# Patient Record
Sex: Male | Born: 1960 | Race: White | Hispanic: No | Marital: Single | Smoking: Never smoker
Health system: Southern US, Community
[De-identification: ages and names within clinical notes are randomized; demographics above are authoritative.]

## PROBLEM LIST (undated history)

## (undated) DIAGNOSIS — Z8269 Family history of other diseases of the musculoskeletal system and connective tissue: Secondary | ICD-10-CM

## (undated) DIAGNOSIS — R946 Abnormal results of thyroid function studies: Secondary | ICD-10-CM

## (undated) DIAGNOSIS — R1314 Dysphagia, pharyngoesophageal phase: Secondary | ICD-10-CM

## (undated) DIAGNOSIS — M791 Myalgia, unspecified site: Secondary | ICD-10-CM

## (undated) DIAGNOSIS — J309 Allergic rhinitis, unspecified: Secondary | ICD-10-CM

## (undated) DIAGNOSIS — M1712 Unilateral primary osteoarthritis, left knee: Secondary | ICD-10-CM

## (undated) DIAGNOSIS — J029 Acute pharyngitis, unspecified: Secondary | ICD-10-CM

## (undated) DIAGNOSIS — N200 Calculus of kidney: Secondary | ICD-10-CM

## (undated) DIAGNOSIS — Z8616 Personal history of COVID-19: Secondary | ICD-10-CM

## (undated) DIAGNOSIS — J209 Acute bronchitis, unspecified: Secondary | ICD-10-CM

## (undated) DIAGNOSIS — I10 Essential (primary) hypertension: Secondary | ICD-10-CM

## (undated) DIAGNOSIS — F4320 Adjustment disorder, unspecified: Secondary | ICD-10-CM

## (undated) DIAGNOSIS — Z8261 Family history of arthritis: Secondary | ICD-10-CM

## (undated) DIAGNOSIS — Z840 Family history of diseases of the skin and subcutaneous tissue: Secondary | ICD-10-CM

## (undated) DIAGNOSIS — N529 Male erectile dysfunction, unspecified: Secondary | ICD-10-CM

## (undated) DIAGNOSIS — E559 Vitamin D deficiency, unspecified: Secondary | ICD-10-CM

## (undated) DIAGNOSIS — D582 Other hemoglobinopathies: Secondary | ICD-10-CM

## (undated) DIAGNOSIS — J329 Chronic sinusitis, unspecified: Secondary | ICD-10-CM

## (undated) DIAGNOSIS — M5410 Radiculopathy, site unspecified: Secondary | ICD-10-CM

## (undated) DIAGNOSIS — K5900 Constipation, unspecified: Secondary | ICD-10-CM

## (undated) DIAGNOSIS — R030 Elevated blood-pressure reading, without diagnosis of hypertension: Secondary | ICD-10-CM

## (undated) DIAGNOSIS — N400 Enlarged prostate without lower urinary tract symptoms: Secondary | ICD-10-CM

## (undated) DIAGNOSIS — K529 Noninfective gastroenteritis and colitis, unspecified: Secondary | ICD-10-CM

## (undated) DIAGNOSIS — M2550 Pain in unspecified joint: Secondary | ICD-10-CM

## (undated) DIAGNOSIS — E291 Testicular hypofunction: Secondary | ICD-10-CM

## (undated) DIAGNOSIS — G4730 Sleep apnea, unspecified: Secondary | ICD-10-CM

## (undated) HISTORY — DX: Allergic rhinitis, unspecified: J30.9

## (undated) HISTORY — DX: Constipation, unspecified: K59.00

## (undated) HISTORY — DX: Chronic sinusitis, unspecified: J32.9

## (undated) HISTORY — DX: Acute pharyngitis, unspecified: J02.9

## (undated) HISTORY — DX: Pain in unspecified joint: M25.50

## (undated) HISTORY — DX: Testicular hypofunction: E29.1

## (undated) HISTORY — DX: Family history of diseases of the skin and subcutaneous tissue: Z84.0

## (undated) HISTORY — DX: Personal history of COVID-19: Z86.16

## (undated) HISTORY — DX: Adjustment disorder, unspecified: F43.20

## (undated) HISTORY — DX: Abnormal results of thyroid function studies: R94.6

## (undated) HISTORY — DX: Dysphagia, pharyngoesophageal phase: R13.14

## (undated) HISTORY — DX: Male erectile dysfunction, unspecified: N52.9

## (undated) HISTORY — DX: Myalgia, unspecified site: M79.10

## (undated) HISTORY — DX: Other hemoglobinopathies: D58.2

## (undated) HISTORY — DX: Family history of other diseases of the musculoskeletal system and connective tissue: Z82.69

## (undated) HISTORY — DX: Radiculopathy, site unspecified: M54.10

## (undated) HISTORY — DX: Elevated blood-pressure reading, without diagnosis of hypertension: R03.0

## (undated) HISTORY — DX: Acute bronchitis, unspecified: J20.9

## (undated) HISTORY — DX: Morbid (severe) obesity due to excess calories: E66.01

## (undated) HISTORY — DX: Unilateral primary osteoarthritis, left knee: M17.12

## (undated) HISTORY — DX: Vitamin D deficiency, unspecified: E55.9

## (undated) HISTORY — DX: Noninfective gastroenteritis and colitis, unspecified: K52.9

## (undated) HISTORY — DX: Family history of arthritis: Z82.61

## (undated) HISTORY — DX: Essential (primary) hypertension: I10

## (undated) HISTORY — DX: Sleep apnea, unspecified: G47.30

## (undated) HISTORY — DX: Benign prostatic hyperplasia without lower urinary tract symptoms: N40.0

## (undated) HISTORY — DX: Calculus of kidney: N20.0

---

## 2013-08-29 ENCOUNTER — Other Ambulatory Visit: Payer: Self-pay | Admitting: Gastroenterology

## 2013-08-29 DIAGNOSIS — R1310 Dysphagia, unspecified: Secondary | ICD-10-CM

## 2013-08-31 ENCOUNTER — Ambulatory Visit
Admission: RE | Admit: 2013-08-31 | Discharge: 2013-08-31 | Disposition: A | Discharge status: Home / Self Care | Payer: 59 | Source: Ambulatory Visit | Attending: Gastroenterology | Admitting: Gastroenterology

## 2013-08-31 DIAGNOSIS — R1310 Dysphagia, unspecified: Secondary | ICD-10-CM

## 2013-09-13 ENCOUNTER — Other Ambulatory Visit: Payer: Self-pay | Admitting: Gastroenterology

## 2013-10-03 ENCOUNTER — Encounter (HOSPITAL_COMMUNITY): Payer: Self-pay | Admitting: Pharmacy Technician

## 2013-10-11 ENCOUNTER — Encounter (HOSPITAL_COMMUNITY): Admission: RE | Payer: Self-pay | Source: Ambulatory Visit

## 2013-10-11 ENCOUNTER — Ambulatory Visit (HOSPITAL_COMMUNITY): Admission: RE | Admit: 2013-10-11 | Payer: 59 | Source: Ambulatory Visit | Admitting: Gastroenterology

## 2013-10-11 SURGERY — EGD (ESOPHAGOGASTRODUODENOSCOPY)
Anesthesia: Moderate Sedation

## 2013-11-01 ENCOUNTER — Other Ambulatory Visit: Payer: Self-pay | Admitting: Internal Medicine

## 2013-11-01 ENCOUNTER — Ambulatory Visit
Admission: RE | Admit: 2013-11-01 | Discharge: 2013-11-01 | Disposition: A | Discharge status: Home / Self Care | Payer: 59 | Source: Ambulatory Visit | Attending: Internal Medicine | Admitting: Internal Medicine

## 2013-11-01 DIAGNOSIS — M545 Low back pain, unspecified: Secondary | ICD-10-CM

## 2013-11-01 DIAGNOSIS — R109 Unspecified abdominal pain: Secondary | ICD-10-CM

## 2013-11-02 ENCOUNTER — Other Ambulatory Visit: Payer: 59

## 2013-11-05 ENCOUNTER — Other Ambulatory Visit: Payer: 59

## 2020-05-08 ENCOUNTER — Other Ambulatory Visit (HOSPITAL_COMMUNITY): Payer: Self-pay | Admitting: Internal Medicine

## 2020-08-15 ENCOUNTER — Telehealth (HOSPITAL_COMMUNITY): Payer: Self-pay | Admitting: Family

## 2020-08-15 NOTE — Telephone Encounter (Signed)
Called to discuss with [name] about Covid symptoms and potential candidacy for the use of sotrovimab, a combination monoclonal antibody infusion for those with mild to moderate Covid symptoms and at a high risk of hospitalization.     Pt is qualified for this infusion at the infusion center due to co-morbid conditions and/or a member of an at-risk group, however unable to reach patient. VM left.   Yeira Gulden,NP

## 2021-01-15 ENCOUNTER — Other Ambulatory Visit (HOSPITAL_COMMUNITY): Payer: Self-pay

## 2021-01-17 ENCOUNTER — Other Ambulatory Visit (HOSPITAL_COMMUNITY): Payer: Self-pay

## 2021-01-17 MED ORDER — AMLODIPINE BESYLATE-VALSARTAN 5-160 MG PO TABS
ORAL_TABLET | ORAL | 0 refills | Status: DC
  Filled 2021-01-17: qty 30, 30d supply, fill #0

## 2021-01-17 MED ORDER — AMLODIPINE BESYLATE-VALSARTAN 5-160 MG PO TABS: ORAL_TABLET | ORAL | 0 refills | Status: DC

## 2021-02-25 ENCOUNTER — Other Ambulatory Visit (HOSPITAL_COMMUNITY): Payer: Self-pay

## 2021-02-25 MED ORDER — AMLODIPINE BESYLATE-VALSARTAN 5-160 MG PO TABS
ORAL_TABLET | ORAL | 3 refills | Status: DC
  Filled 2021-02-25: qty 30, 30d supply, fill #0

## 2021-02-26 ENCOUNTER — Other Ambulatory Visit (HOSPITAL_COMMUNITY): Payer: Self-pay

## 2021-06-18 ENCOUNTER — Other Ambulatory Visit (HOSPITAL_COMMUNITY): Payer: Self-pay

## 2021-10-02 ENCOUNTER — Other Ambulatory Visit (HOSPITAL_COMMUNITY): Payer: Self-pay

## 2021-10-02 MED ORDER — AMLODIPINE BESYLATE-VALSARTAN 5-160 MG PO TABS
1.0000 | ORAL_TABLET | Freq: Every day | ORAL | 3 refills | Status: DC
Start: 2021-10-02 — End: 2022-03-05
  Filled 2021-10-02 – 2021-11-08 (×2): qty 30, 30d supply, fill #0

## 2021-10-10 ENCOUNTER — Other Ambulatory Visit (HOSPITAL_COMMUNITY): Payer: Self-pay

## 2021-10-15 ENCOUNTER — Other Ambulatory Visit: Payer: Self-pay | Admitting: Sports Medicine

## 2021-10-15 DIAGNOSIS — M25562 Pain in left knee: Secondary | ICD-10-CM

## 2021-10-27 ENCOUNTER — Ambulatory Visit
Admission: RE | Admit: 2021-10-27 | Discharge: 2021-10-27 | Disposition: A | Discharge status: Home / Self Care | Payer: Self-pay | Source: Ambulatory Visit | Attending: Sports Medicine | Admitting: Sports Medicine

## 2021-10-27 ENCOUNTER — Other Ambulatory Visit: Payer: Self-pay

## 2021-10-27 DIAGNOSIS — M25562 Pain in left knee: Secondary | ICD-10-CM

## 2021-11-08 ENCOUNTER — Other Ambulatory Visit (HOSPITAL_COMMUNITY): Payer: Self-pay

## 2022-03-05 ENCOUNTER — Encounter: Payer: Self-pay | Admitting: Interventional Cardiology

## 2022-03-05 ENCOUNTER — Ambulatory Visit: Payer: 59

## 2022-03-05 ENCOUNTER — Ambulatory Visit: Payer: 59 | Admitting: Interventional Cardiology

## 2022-03-05 VITALS — BP 140/70 | HR 75 | Ht 76.0 in | Wt 332.0 lb

## 2022-03-05 DIAGNOSIS — R002 Palpitations: Secondary | ICD-10-CM

## 2022-03-05 DIAGNOSIS — I10 Essential (primary) hypertension: Secondary | ICD-10-CM | POA: Diagnosis not present

## 2022-03-05 DIAGNOSIS — R072 Precordial pain: Secondary | ICD-10-CM

## 2022-03-05 MED ORDER — METOPROLOL TARTRATE 100 MG PO TABS: ORAL_TABLET | ORAL | 0 refills | Status: DC

## 2022-03-05 NOTE — Patient Instructions (Addendum)
Medication Instructions:  Your physician recommends that you continue on your current medications as directed. Please refer to the Current Medication list given to you today.  *If you need a refill on your cardiac medications before your next appointment, please call your pharmacy*   Lab Work: Lab work to be done today--BMP If you have labs (blood work) drawn today and your tests are completely normal, you will receive your results only by: MyChart Message (if you have MyChart) OR A paper copy in the mail If you have any lab test that is abnormal or we need to change your treatment, we will call you to review the results.   Testing/Procedures: Your physician has requested that you have cardiac CT. Cardiac computed tomography (CT) is a painless test that uses an x-ray machine to take clear, detailed pictures of your heart. For further information please visit [email or web address] Please follow instruction sheet as given.   Dr [name] recommends you wear a preventice monitor for 2 weeks.    Follow-Up: At [facility], you and your health needs are our priority.  As part of our continuing mission to provide you with exceptional heart care, we have created designated Provider Care Teams.  These Care Teams include your primary Cardiologist (physician) and Advanced Practice Providers (APPs -  Physician Assistants and Nurse Practitioners) who all work together to provide you with the care you need, when you need it.  We recommend signing up for the patient portal called "MyChart".  Sign up information is provided on this After Visit Summary.  MyChart is used to connect with patients for Virtual Visits (Telemedicine).  Patients are able to view lab/test results, encounter notes, upcoming appointments, etc.  Non-urgent messages can be sent to your provider as well.   To learn more about what you can do with MyChart, go to [email or web address].    Your next appointment:   Based on  results  The format for your next appointment:   In Person  Provider:   Lance Muss, MD     Other Instructions     Your cardiac CT will be scheduled at one of the below locations:   [facility] [address] [place], [place] [address] [phone]  OR  [facility] [address] [place], [place] [address] [phone]  If scheduled at [facility], please arrive at the [facility] and Children's Entrance (Entrance C2) of [facility] 30 minutes prior to test start time. You can use the FREE valet parking offered at entrance C (encouraged to control the heart rate for the test)  Proceed to the [facility] Radiology Department (first floor) to check-in and test prep.  All radiology patients and guests should use entrance C2 at [facility], accessed from [facility], even though the hospital's physical address listed is [address]    If scheduled at [facility], please arrive 15 mins early for check-in and test prep.  Please follow these instructions carefully (unless otherwise directed):  Hold all erectile dysfunction medications at least 3 days (72 hrs) prior to test.  On the Night Before the Test: Be sure to Drink plenty of water. Do not consume any caffeinated/decaffeinated beverages or chocolate 12 hours prior to your test. Do not take any antihistamines 12 hours prior to your test.   On the Day of the Test: Drink plenty of water until 1 hour prior to the test. Do not eat any  food 4 hours prior to the test. You may take your regular medications prior to the test.  Take metoprolol (Lopressor) two hours prior to test. HOLD Furosemide/Hydrochlorothiazide morning of the test.       After the Test: Drink plenty of water. After receiving IV contrast, you may experience a mild flushed feeling. This is normal. On occasion, you may  experience a mild rash up to 24 hours after the test. This is not dangerous. If this occurs, you can take Benadryl 25 mg and increase your fluid intake. If you experience trouble breathing, this can be serious. If it is severe call 911 IMMEDIATELY. If it is mild, please call our office. If you take any of these medications: Glipizide/Metformin, Avandament, Glucavance, please do not take 48 hours after completing test unless otherwise instructed.  We will call to schedule your test 2-4 weeks out understanding that some insurance companies will need an authorization prior to the service being performed.   For non-scheduling related questions, please contact the cardiac imaging nurse navigator should you have any questions/concerns: Rockwell Alexandria, Cardiac Imaging Nurse Navigator Larey Brick, Cardiac Imaging Nurse Navigator [facility] Direct Office Dial: [phone]   For scheduling needs, including cancellations and rescheduling, please call [place], [phone].    Important Information About Sugar

## 2022-03-05 NOTE — Progress Notes (Signed)
Cardiology Office Note   Date:  03/05/2022   ID:  [name], DOB 1961/01/22, MRN [id number]  PCP:  Pcp, No    No chief complaint on file.  Chest pain  Wt Readings from Last 3 Encounters:  03/05/22 (!) 332 lb (150.6 kg)       History of Present Illness: Brett Lowe is a 61 y.o. male who is being seen today for the evaluation of chest pain, palpitations at the request of Marden Noble, MD.   Palpitations were worse in 3/23 when he was taking Wegovy.  Sx improved when he stopped.  He still has occasional flutter.  Feels like a skip beat and a stop for a second.  He had a prior stress test in 2013 at [facility].  Negative stress test. Nothing recent.  Father died at 104 of an MI.  Siblings are healthy, no heart issues.   Has had chest pain- like a tightness/pressure during intercourse. Worse with activity.   Had a left knee injury several months ago.    He lifts weight for exercise.  Walking now limited due to knee pain.  Denies : Dizziness. Leg edema. Nitroglycerin use. Orthopnea.  Paroxysmal nocturnal dyspnea. Shortness of breath. Syncope.       Past Medical History:  Diagnosis Date   Abnormal hemoglobin (HCC)    Abnormal thyroid function test    Acute bronchitis    Adjustment disorder    Allergic rhinitis    Arthralgia of multiple joints    Arthritis of left knee    BPH (benign prostatic hyperplasia)    Calculus of kidney    Chronic sinusitis    Constipation    Dysphagia, pharyngoesophageal phase    ED (erectile dysfunction)    Elevated blood pressure reading    Family history of lupus erythematosus    Family history of polymyalgia rheumatica    Family history of rheumatoid arthritis    Gastroenteritis    History of COVID-19    HTN (hypertension)    Hypogonadism in male    Male erectile dysfunction, unspecified    Morbid obesity (HCC)    Morbidly obese (HCC)    Myalgia    Pharyngitis    Polyarthralgia    Radiculopathy    Sleep apnea    Testicular  hypofunction    Vitamin D deficiency        Current Outpatient Medications  Medication Sig Dispense Refill   amLODipine-valsartan (EXFORGE) 5-160 MG tablet TAKE 1/2-1 TABLET BY MOUTH ONCE A DAY TO KEEP BLOOD PRESSURE SYSTOLIC LESS THAN 130 30 tablet 0   aspirin EC 81 MG tablet Take 81 mg by mouth daily. Swallow whole.     B Complex-C (B-COMPLEX WITH VITAMIN C) tablet Take 1 tablet by mouth daily.     naproxen sodium (ANAPROX) 220 MG tablet Take 440-660 mg by mouth 2 (two) times daily as needed (pain).     testosterone enanthate (DELATESTRYL) 200 MG/ML injection Inject into the muscle every 14 (fourteen) days. For IM use only     VITAMIN D, CHOLECALCIFEROL, PO Take 2,500 Units by mouth daily.     No current facility-administered medications for this visit.    Allergies:   Penicillins    Social History:  The patient  reports that he has never smoked. He does not have any smokeless tobacco history on file.   Family History:  The patient's family history includes Heart attack in his father; Stroke in his brother.  ROS:  Please see the history of present illness.   Otherwise, review of systems are positive for difficulty losing weight.   All other systems are reviewed and negative.    PHYSICAL EXAM: VS:  BP 140/70 (BP Location: Left Arm, Patient Position: Sitting, Cuff Size: Normal)   Pulse 75   Ht 6\' 4"  (1.93 m)   Wt (!) 332 lb (150.6 kg)   BMI 40.41 kg/m  , BMI Body mass index is 40.41 kg/m. GEN: Well nourished, well developed, in no acute distress HEENT: normal Neck: no JVD, carotid bruits, or masses Cardiac: RRR; no murmurs, rubs, or gallops,no edema  Respiratory:  clear to auscultation bilaterally, normal work of breathing GI: soft, nontender, nondistended, + BS MS: no deformity or atrophy Skin: warm and dry, no rash Neuro:  Strength and sensation are intact Psych: euthymic mood, full affect   EKG:   The ekg ordered today demonstrates Normal ECG   Recent  Labs: No results found for requested labs within last 365 days.   Lipid Panel No results found for: "CHOL", "TRIG", "HDL", "CHOLHDL", "VLDL", "LDLCALC", "LDLDIRECT"   Other studies Reviewed: Additional studies/ records that were reviewed today with results demonstrating: labs reviewed.   ASSESSMENT AND PLAN:  Chest pain: Some pressure and tightness.   Plan for CTA.  Take metoprolol 100 mg x 1 before the CT scan.  Several RF for CAD.  HTN: Avoid processed foods.  Limit excessive salt.  The current medical regimen is effective;  continue present plan and medications. Morbid obesity: High-fiber diet.  Whole food plant-based diet.  Avoiding red meat. Palpitations: plan for 2 week Zio patch.  Sounds like premature beats.   Current medicines are reviewed at length with the patient today.  The patient concerns regarding his medicines were addressed.  The following changes have been made:  No change  Labs/ tests ordered today include:  No orders of the defined types were placed in this encounter.   Recommend 150 minutes/week of aerobic exercise Low fat, low carb, high fiber diet recommended  Disposition:   FU for CTA   Signed, , MD  03/05/2022 4:19 PM    [facility] [address] [place], [place], [place]  [place] Phone: [phone]; Fax: [phone]

## 2022-03-05 NOTE — Progress Notes (Unsigned)
Enrolled patient for a 14 day preventice long term monitor to be mailed to patients home

## 2022-03-06 LAB — BASIC METABOLIC PANEL WITH GFR
BUN/Creatinine Ratio: 11 (ref 10–24)
BUN: 11 mg/dL (ref 8–27)
CO2: 22 mmol/L (ref 20–29)
Calcium: 9.7 mg/dL (ref 8.6–10.2)
Chloride: 104 mmol/L (ref 96–106)
Creatinine, Ser: 0.96 mg/dL (ref 0.76–1.27)
Glucose: 85 mg/dL (ref 70–99)
Potassium: 4.5 mmol/L (ref 3.5–5.2)
Sodium: 141 mmol/L (ref 134–144)
eGFR: 90 mL/min/{1.73_m2}

## 2022-03-31 ENCOUNTER — Telehealth (HOSPITAL_COMMUNITY): Payer: Self-pay | Admitting: *Deleted

## 2022-03-31 NOTE — Telephone Encounter (Signed)
Reaching out to patient to offer assistance regarding upcoming cardiac imaging study; pt verbalizes understanding of appt date/time, parking situation and where to check in, pre-test NPO status and medications ordered, and verified current allergies; name and call back number provided for further questions should they arise  Larey Brick RN Navigator Cardiac Imaging [facility] [phone] office [phone] cell  Patient to take 100mg  metoprolol tartrate two hours prior to his cardiac CT scan. He aware to hold his Viagra and to arrive at 2:30pm.

## 2022-04-01 ENCOUNTER — Other Ambulatory Visit: Payer: Self-pay | Admitting: Cardiology

## 2022-04-01 ENCOUNTER — Ambulatory Visit (HOSPITAL_COMMUNITY)
Admission: RE | Admit: 2022-04-01 | Discharge: 2022-04-01 | Disposition: A | Discharge status: Home / Self Care | Payer: 59 | Source: Ambulatory Visit | Attending: Interventional Cardiology | Admitting: Interventional Cardiology

## 2022-04-01 DIAGNOSIS — R072 Precordial pain: Secondary | ICD-10-CM | POA: Insufficient documentation

## 2022-04-01 DIAGNOSIS — I2510 Atherosclerotic heart disease of native coronary artery without angina pectoris: Secondary | ICD-10-CM | POA: Diagnosis not present

## 2022-04-01 DIAGNOSIS — R931 Abnormal findings on diagnostic imaging of heart and coronary circulation: Secondary | ICD-10-CM

## 2022-04-01 MED ORDER — IOHEXOL 350 MG/ML SOLN
100.0000 mL | Freq: Once | INTRAVENOUS | Status: AC | PRN
  Administered 2022-04-01: 100 mL via INTRAVENOUS

## 2022-04-01 MED ORDER — NITROGLYCERIN 0.4 MG SL SUBL
SUBLINGUAL_TABLET | SUBLINGUAL | Status: AC
  Filled 2022-04-01: qty 2

## 2022-04-01 MED ORDER — NITROGLYCERIN 0.4 MG SL SUBL
0.8000 mg | SUBLINGUAL_TABLET | Freq: Once | SUBLINGUAL | Status: AC
  Administered 2022-04-01: 0.8 mg via SUBLINGUAL

## 2022-04-02 ENCOUNTER — Ambulatory Visit (HOSPITAL_BASED_OUTPATIENT_CLINIC_OR_DEPARTMENT_OTHER)
Admission: RE | Admit: 2022-04-02 | Discharge: 2022-04-02 | Disposition: A | Discharge status: Home / Self Care | Payer: 59 | Source: Ambulatory Visit | Attending: Cardiology | Admitting: Cardiology

## 2022-04-02 ENCOUNTER — Ambulatory Visit (HOSPITAL_COMMUNITY)
Admission: RE | Admit: 2022-04-02 | Discharge: 2022-04-02 | Disposition: A | Discharge status: Home / Self Care | Payer: 59 | Source: Ambulatory Visit | Attending: Cardiology | Admitting: Cardiology

## 2022-04-02 DIAGNOSIS — R931 Abnormal findings on diagnostic imaging of heart and coronary circulation: Secondary | ICD-10-CM | POA: Diagnosis not present

## 2022-04-07 ENCOUNTER — Telehealth: Payer: Self-pay

## 2022-04-07 MED ORDER — RANOLAZINE ER 500 MG PO TB12: 500.0000 mg | ORAL_TABLET | Freq: Two times a day (BID) | ORAL | 11 refills | Status: DC

## 2022-04-07 NOTE — Telephone Encounter (Signed)
Called and discussed Dr. Hoyle Barr recommendations: Can try Ranexa 500 mg BID.  If CP persists, then would consider cath.   Patient states he's not keen on taking medication but will try the Ranexa and let us know if it does not work to relieve his CP.  Ranexa 500mg  BID ordered and sent to pharmacy of choice.

## 2022-04-07 NOTE — Telephone Encounter (Signed)
-----   Message from Corky Crafts, MD sent at 04/04/2022  5:54 PM EDT ----- Can try Ranexa 500 mg BID.  If CP persists, then would consider cath.  ----- Message ----- From: Alois Cliche, LPN Sent: 8/83/2549  11:25 AM EDT To: Corky Crafts, MD  Discussed with pt ct results and would prefer not to start on Imdur if that means has to give up Viagra Will forward to Dr [name] for recommendations .Zack Seal

## 2022-04-22 ENCOUNTER — Telehealth: Payer: Self-pay | Admitting: *Deleted

## 2022-04-22 NOTE — Telephone Encounter (Signed)
-----   Message from Corky Crafts, MD sent at 04/02/2022 10:49 AM EDT ----- Blockage in the distal RCA. Everything else looks ok.  Trying Imdur as I noted on the CT FFR result. Calcified granulomas noted in the lung.  May be from prior infections but would send to PMD, [facility] for further management.

## 2022-04-22 NOTE — Telephone Encounter (Signed)
No PCP listed.  I placed call to patient.  Unable to leave message as mailbox is full.

## 2022-05-01 MED ORDER — NITROGLYCERIN 0.4 MG SL SUBL: 0.4000 mg | SUBLINGUAL_TABLET | SUBLINGUAL | 6 refills | Status: AC | PRN

## 2022-05-01 NOTE — Telephone Encounter (Signed)
I spoke with patient and reviewed Cardiac CT results with him.  He has been taking Ranexa only as needed. Had to take twice yesterday due to chest pain.  He will begin taking it twice daily.  He does not have SL NTG and is willing to take if needed.  He is aware not to use SL NTG and Viagra in the same 24 hour period.  Use of SL NTG reviewed with patient and prescription sent to [facility]. Appointment made for patient to see Dr [name] on 9/18 at 2:30 for evaluation of chest pain.  Patient reports he saw Dr Kevan Ny but does not have new PCP assigned when Dr Prince Rome. I checked with [facility] and results can still be sent to Dr Kevan Ny.  CT results faxed to Dr Kevan Ny.  Patient has not received monitor.  Will follow up with monitor department.

## 2022-05-01 NOTE — Telephone Encounter (Signed)
Pt returning call. Transferred to Elease Hashimoto, Charity fundraiser.

## 2022-05-01 NOTE — Telephone Encounter (Signed)
Unable to leave message as mailbox is full. Will send my chart message to patient asking him to contact office.

## 2022-05-02 NOTE — Telephone Encounter (Signed)
I spoke with patient and due to Dr Hoyle Barr schedule as DOD on September 18 appointment changed to 2:45 on September 18 with Robin Searing, NP.  Patient aware monitor will be applied after this appointment.

## 2022-05-04 NOTE — H&P (View-Only) (Signed)
Office Visit    Patient Name: Brett Lowe Date of Encounter: 05/05/2022  Primary Care Provider:  Pcp, No Primary Cardiologist:  [name], MD Primary Electrophysiologist: None  Chief Complaint    Brett Lowe is a 61 y.o. male with PMH of nonobstructive CAD s/p cardiac CTA 03/2022 distal lesion per FFR stable angina, palpitations who presents today for complaint of increased palpitations.  Past Medical History    Past Medical History:  Diagnosis Date   Abnormal hemoglobin (HCC)    Abnormal thyroid function test    Acute bronchitis    Adjustment disorder    Allergic rhinitis    Arthralgia of multiple joints    Arthritis of left knee    BPH (benign prostatic hyperplasia)    Calculus of kidney    Chronic sinusitis    Constipation    Dysphagia, pharyngoesophageal phase    ED (erectile dysfunction)    Elevated blood pressure reading    Family history of lupus erythematosus    Family history of polymyalgia rheumatica    Family history of rheumatoid arthritis    Gastroenteritis    History of COVID-19    HTN (hypertension)    Hypogonadism in male    Male erectile dysfunction, unspecified    Morbid obesity ([place])    Morbidly obese (HCC)    Myalgia    Pharyngitis    Polyarthralgia    Radiculopathy    Sleep apnea    Testicular hypofunction    Vitamin D deficiency    History reviewed. No pertinent surgical history.  Allergies  Allergies  Allergen Reactions   Penicillins Rash    History of Present Illness    Brett Lowe  is a 61 year old male with the above mention past medical history who presents today for complaint of increased palpitations.  He was initially seen by Dr. Irish Lack on 02/2022 for consultation by PCP for complaint of chest pain and palpitations.  Patient endorsed feelings of skipped beat that also have occasional fluttering.  Patient had stress testing completed in 2013 at [facility] that was negative.  Patient does endorse chest discomfort and  tightness during intercourse and worse with activity.  Cardiac CTA was completed and revealed distal lesion in RCA measuring 70-90% that is flow-limiting per FFR with no significant calcium located in other arteries.  He was started on Ranexa 500 mg twice daily for chest pain.  Patient was advised to not use with Viagra.  Patient has had to take medication twice recently and also advised to take as needed nitroglycerin for persistent pain.  Patient presents today for event monitor placement to evaluate palpitations further.  Mr. Yerger presents today for follow-up alone.  Since last being seen in the office patient reports that he is still experiencing chest discomfort with intercourse and exercise.  He was originally not taking Ranexa correctly and is now taking it twice daily.  He states that the pain occurs and lasts for at least an hour and he becomes panicked and gets sweaty.  He also endorses pain traveling down the left arm with this discomfort.  Is currently trying to lose weight and is watching his calories.  He denies continued palpitations and states they have slowed down since his previous visit.  His blood pressures were elevated today at 142/76 patient denies chest pain, palpitations, dyspnea, PND, orthopnea, nausea, vomiting, dizziness, syncope, edema, weight gain, or early satiety.  Home Medications    Current Outpatient Medications  Medication  Sig Dispense Refill   aspirin EC 81 MG tablet Take 81 mg by mouth daily. Swallow whole.     B Complex-C (B-COMPLEX WITH VITAMIN C) tablet Take 1 tablet by mouth daily.     metoprolol tartrate (LOPRESSOR) 100 MG tablet Take one tablet by mouth 2 hours prior to CT Scan 1 tablet 0   naproxen sodium (ANAPROX) 220 MG tablet Take 440-660 mg by mouth 2 (two) times daily as needed (pain).     nitroGLYCERIN (NITROSTAT) 0.4 MG SL tablet Place 1 tablet (0.4 mg total) under the tongue every 5 (five) minutes as needed for chest pain. 25 tablet 6   ranolazine  (RANEXA) 500 MG 12 hr tablet Take 1 tablet (500 mg total) by mouth 2 (two) times daily. 60 tablet 11   sildenafil (VIAGRA) 50 MG tablet Take 50 mg by mouth daily as needed for erectile dysfunction.     testosterone enanthate (DELATESTRYL) 200 MG/ML injection Inject into the muscle every 14 (fourteen) days. For IM use only     VITAMIN D, CHOLECALCIFEROL, PO Take 2,500 Units by mouth daily.     No current facility-administered medications for this visit.     Review of Systems  Please see the history of present illness.    (+) Chest pain with exertion (+) Left arm pain  All other systems reviewed and are otherwise negative except as noted above.  Physical Exam    Wt Readings from Last 3 Encounters:  05/05/22 (!) 325 lb (147.4 kg)  03/05/22 (!) 332 lb (150.6 kg)   VS: Vitals:   05/05/22 1505  BP: (!) 142/76  Pulse: 69  SpO2: 94%  ,Body mass index is 39.56 kg/m.  Constitutional:      Appearance: Healthy appearance. Not in distress.  Neck:     Vascular: JVD normal.  Pulmonary:     Effort: Pulmonary effort is normal.     Breath sounds: No wheezing. No rales. Diminished in the bases Cardiovascular:     Normal rate. Regular rhythm. Normal S1. Normal S2.      Murmurs: There is no murmur.  Edema:    Peripheral edema absent.  Abdominal:     Palpations: Abdomen is soft non tender. There is no hepatomegaly.  Skin:    General: Skin is warm and dry.  Neurological:     General: No focal deficit present.     Mental Status: Alert and oriented to person, place and time.     Cranial Nerves: Cranial nerves are intact.  EKG/LABS/Other Studies Reviewed    ECG personally reviewed by me today -sinus rhythm with nonspecific T wave abnormality and rate of 69 bpm with normal axis and no acute changes noted.   No results found for: "WBC", "HGB", "HCT", "MCV", "PLT" Lab Results  Component Value Date   CREATININE 0.96 03/05/2022   BUN 11 03/05/2022   NA 141 03/05/2022   K 4.5 03/05/2022    CL 104 03/05/2022   CO2 22 03/05/2022   No results found for: "ALT", "AST", "GGT", "ALKPHOS", "BILITOT" No results found for: "CHOL", "HDL", "LDLCALC", "LDLDIRECT", "TRIG", "CHOLHDL"  No results found for: "HGBA1C"  Assessment & Plan    1.  Coronary artery disease: -Cardiac CTA completed on 04/01/2022 that showed occlusive distal lesion and RCA. -Patient has recent complaints of chest pain requiring Ranexa for relief.  He was reportedly not taking medication correctly and is now taking 400 mg twice daily -Today patient reports chest pain associated with physical activity and  exertion. -Case discussed with DOD Dr.Varanasi who agrees with pursuing left heart catheterization to rule out any new ischemic changes related to cardiac CTA results -BMET and CBC today  2.  Palpitations: -Today patient reports that palpitations have decreased and density and quantity -We will hold off on ZIO monitor at this time and patient was encouraged to report if palpitations increase in quantity  3.  Hypertension: -Patient's blood pressure today was elevated today at 142/76 -We will increase Exforge to 10/360 mg daily  4.  Chest discomfort: -Today patient reports ongoing chest discomfort that last for over an hour and is relieved with nitroglycerin. -Patient advised to seek care at the emergency room if pain becomes unbearable and increases in intensity. -Please see plan as noted above regarding left heart catheterization    Disposition: Follow-up with Lance Muss, MD or APP in 2 weeks Shared Decision Making/Informed Consent The risks [stroke (1 in 1000), death (1 in 1000), kidney failure [usually temporary] (1 in 500), bleeding (1 in 200), allergic reaction [possibly serious] (1 in 200)], benefits (diagnostic support and management of coronary artery disease) and alternatives of a cardiac catheterization were discussed in detail with Mr. Doo and he is willing to proceed.   Medication  Adjustments/Labs and Tests Ordered: Current medicines are reviewed at length with the patient today.  Concerns regarding medicines are outlined above.   Signed, Napoleon Form, Leodis Rains, NP 05/05/2022, 4:16 PM [facility]

## 2022-05-04 NOTE — Progress Notes (Unsigned)
Office Visit    Patient Name: Brett Lowe Date of Encounter: 05/05/2022  Primary Care Provider:  Pcp, No Primary Cardiologist:  [name], MD Primary Electrophysiologist: None  Chief Complaint    Brett Lowe is a 61 y.o. male with PMH of nonobstructive CAD s/p cardiac CTA 03/2022 distal lesion per FFR stable angina, palpitations who presents today for complaint of increased palpitations.  Past Medical History    Past Medical History:  Diagnosis Date   Abnormal hemoglobin (HCC)    Abnormal thyroid function test    Acute bronchitis    Adjustment disorder    Allergic rhinitis    Arthralgia of multiple joints    Arthritis of left knee    BPH (benign prostatic hyperplasia)    Calculus of kidney    Chronic sinusitis    Constipation    Dysphagia, pharyngoesophageal phase    ED (erectile dysfunction)    Elevated blood pressure reading    Family history of lupus erythematosus    Family history of polymyalgia rheumatica    Family history of rheumatoid arthritis    Gastroenteritis    History of COVID-19    HTN (hypertension)    Hypogonadism in male    Male erectile dysfunction, unspecified    Morbid obesity ([place])    Morbidly obese (HCC)    Myalgia    Pharyngitis    Polyarthralgia    Radiculopathy    Sleep apnea    Testicular hypofunction    Vitamin D deficiency    History reviewed. No pertinent surgical history.  Allergies  Allergies  Allergen Reactions   Penicillins Rash    History of Present Illness    Brett Lowe  is a 61 year old male with the above mention past medical history who presents today for complaint of increased palpitations.  He was initially seen by Dr. Irish Lack on 02/2022 for consultation by PCP for complaint of chest pain and palpitations.  Patient endorsed feelings of skipped beat that also have occasional fluttering.  Patient had stress testing completed in 2013 at [facility] that was negative.  Patient does endorse chest discomfort and  tightness during intercourse and worse with activity.  Cardiac CTA was completed and revealed distal lesion in RCA measuring 70-90% that is flow-limiting per FFR with no significant calcium located in other arteries.  He was started on Ranexa 500 mg twice daily for chest pain.  Patient was advised to not use with Viagra.  Patient has had to take medication twice recently and also advised to take as needed nitroglycerin for persistent pain.  Patient presents today for event monitor placement to evaluate palpitations further.  Brett Lowe presents today for follow-up alone.  Since last being seen in the office patient reports that he is still experiencing chest discomfort with intercourse and exercise.  He was originally not taking Ranexa correctly and is now taking it twice daily.  He states that the pain occurs and lasts for at least an hour and he becomes panicked and gets sweaty.  He also endorses pain traveling down the left arm with this discomfort.  Is currently trying to lose weight and is watching his calories.  He denies continued palpitations and states they have slowed down since his previous visit.  His blood pressures were elevated today at 142/76 patient denies chest pain, palpitations, dyspnea, PND, orthopnea, nausea, vomiting, dizziness, syncope, edema, weight gain, or early satiety.  Home Medications    Current Outpatient Medications  Medication  Sig Dispense Refill   aspirin EC 81 MG tablet Take 81 mg by mouth daily. Swallow whole.     B Complex-C (B-COMPLEX WITH VITAMIN C) tablet Take 1 tablet by mouth daily.     metoprolol tartrate (LOPRESSOR) 100 MG tablet Take one tablet by mouth 2 hours prior to CT Scan 1 tablet 0   naproxen sodium (ANAPROX) 220 MG tablet Take 440-660 mg by mouth 2 (two) times daily as needed (pain).     nitroGLYCERIN (NITROSTAT) 0.4 MG SL tablet Place 1 tablet (0.4 mg total) under the tongue every 5 (five) minutes as needed for chest pain. 25 tablet 6   ranolazine  (RANEXA) 500 MG 12 hr tablet Take 1 tablet (500 mg total) by mouth 2 (two) times daily. 60 tablet 11   sildenafil (VIAGRA) 50 MG tablet Take 50 mg by mouth daily as needed for erectile dysfunction.     testosterone enanthate (DELATESTRYL) 200 MG/ML injection Inject into the muscle every 14 (fourteen) days. For IM use only     VITAMIN D, CHOLECALCIFEROL, PO Take 2,500 Units by mouth daily.     No current facility-administered medications for this visit.     Review of Systems  Please see the history of present illness.    (+) Chest pain with exertion (+) Left arm pain  All other systems reviewed and are otherwise negative except as noted above.  Physical Exam    Wt Readings from Last 3 Encounters:  05/05/22 (!) 325 lb (147.4 kg)  03/05/22 (!) 332 lb (150.6 kg)   VS: Vitals:   05/05/22 1505  BP: (!) 142/76  Pulse: 69  SpO2: 94%  ,Body mass index is 39.56 kg/m.  Constitutional:      Appearance: Healthy appearance. Not in distress.  Neck:     Vascular: JVD normal.  Pulmonary:     Effort: Pulmonary effort is normal.     Breath sounds: No wheezing. No rales. Diminished in the bases Cardiovascular:     Normal rate. Regular rhythm. Normal S1. Normal S2.      Murmurs: There is no murmur.  Edema:    Peripheral edema absent.  Abdominal:     Palpations: Abdomen is soft non tender. There is no hepatomegaly.  Skin:    General: Skin is warm and dry.  Neurological:     General: No focal deficit present.     Mental Status: Alert and oriented to person, place and time.     Cranial Nerves: Cranial nerves are intact.  EKG/LABS/Other Studies Reviewed    ECG personally reviewed by me today -sinus rhythm with nonspecific T wave abnormality and rate of 69 bpm with normal axis and no acute changes noted.   No results found for: "WBC", "HGB", "HCT", "MCV", "PLT" Lab Results  Component Value Date   CREATININE 0.96 03/05/2022   BUN 11 03/05/2022   NA 141 03/05/2022   K 4.5 03/05/2022    CL 104 03/05/2022   CO2 22 03/05/2022   No results found for: "ALT", "AST", "GGT", "ALKPHOS", "BILITOT" No results found for: "CHOL", "HDL", "LDLCALC", "LDLDIRECT", "TRIG", "CHOLHDL"  No results found for: "HGBA1C"  Assessment & Plan    1.  Coronary artery disease: -Cardiac CTA completed on 04/01/2022 that showed occlusive distal lesion and RCA. -Patient has recent complaints of chest pain requiring Ranexa for relief.  He was reportedly not taking medication correctly and is now taking 400 mg twice daily -Today patient reports chest pain associated with physical activity and  exertion. -Case discussed with DOD Dr.Varanasi who agrees with pursuing left heart catheterization to rule out any new ischemic changes related to cardiac CTA results -BMET and CBC today  2.  Palpitations: -Today patient reports that palpitations have decreased and density and quantity -We will hold off on ZIO monitor at this time and patient was encouraged to report if palpitations increase in quantity  3.  Hypertension: -Patient's blood pressure today was elevated today at 142/76 -We will increase Exforge to 10/360 mg daily  4.  Chest discomfort: -Today patient reports ongoing chest discomfort that last for over an hour and is relieved with nitroglycerin. -Patient advised to seek care at the emergency room if pain becomes unbearable and increases in intensity. -Please see plan as noted above regarding left heart catheterization    Disposition: Follow-up with Lance Muss, MD or APP in 2 weeks Shared Decision Making/Informed Consent The risks [stroke (1 in 1000), death (1 in 1000), kidney failure [usually temporary] (1 in 500), bleeding (1 in 200), allergic reaction [possibly serious] (1 in 200)], benefits (diagnostic support and management of coronary artery disease) and alternatives of a cardiac catheterization were discussed in detail with Mr. Kropp and he is willing to proceed.   Medication  Adjustments/Labs and Tests Ordered: Current medicines are reviewed at length with the patient today.  Concerns regarding medicines are outlined above.   Signed, Napoleon Form, Leodis Rains, NP 05/05/2022, 4:16 PM [facility]

## 2022-05-05 ENCOUNTER — Ambulatory Visit: Payer: 59

## 2022-05-05 ENCOUNTER — Ambulatory Visit: Payer: 59 | Attending: Nurse Practitioner | Admitting: Nurse Practitioner

## 2022-05-05 ENCOUNTER — Encounter: Payer: Self-pay | Admitting: Nurse Practitioner

## 2022-05-05 ENCOUNTER — Ambulatory Visit: Payer: 59 | Admitting: Interventional Cardiology

## 2022-05-05 VITALS — BP 142/76 | HR 69 | Ht 76.0 in | Wt 325.0 lb

## 2022-05-05 DIAGNOSIS — R072 Precordial pain: Secondary | ICD-10-CM | POA: Diagnosis not present

## 2022-05-05 DIAGNOSIS — I10 Essential (primary) hypertension: Secondary | ICD-10-CM | POA: Diagnosis not present

## 2022-05-05 DIAGNOSIS — R002 Palpitations: Secondary | ICD-10-CM

## 2022-05-05 DIAGNOSIS — I25118 Atherosclerotic heart disease of native coronary artery with other forms of angina pectoris: Secondary | ICD-10-CM

## 2022-05-05 MED ORDER — AMLODIPINE BESYLATE-VALSARTAN 10-320 MG PO TABS: 1.0000 | ORAL_TABLET | Freq: Every day | ORAL | 3 refills | Status: AC

## 2022-05-05 MED ORDER — SODIUM CHLORIDE 0.9% FLUSH: 3.0000 mL | Freq: Two times a day (BID) | INTRAVENOUS | Status: DC

## 2022-05-05 NOTE — Patient Instructions (Addendum)
Medication Instructions:  Your physician has recommended you make the following change in your medication:   INCREASE the X-Forge to 10-320 taking 1 daily   *If you need a refill on your cardiac medications before your next appointment, please call your pharmacy*   Lab Work: TODAY:  BMET & CBC  If you have labs (blood work) drawn today and your tests are completely normal, you will receive your results only by: MyChart Message (if you have MyChart) OR A paper copy in the mail If you have any lab test that is abnormal or we need to change your treatment, we will call you to review the results.   Testing/Procedures: Your physician has requested that you have a cardiac catheterization. Cardiac catheterization is used to diagnose and/or treat various heart conditions. Doctors may recommend this procedure for a number of different reasons. The most common reason is to evaluate chest pain. Chest pain can be a symptom of coronary artery disease (CAD), and cardiac catheterization can show whether plaque is narrowing or blocking your heart's arteries. This procedure is also used to evaluate the valves, as well as measure the blood flow and oxygen levels in different parts of your heart. For further information please visit [email or web address] Please follow instruction sheet, BELOW:   [facility] A DEPT OF [place] A DEPT OF [facility] [address] [place], [place] [address] [id number] [facility] [place] [address] Dept: [phone] Loc: [phone]  Brett Lowe  05/05/2022  You are scheduled for a Cardiac Catheterization on Tuesday, September 26 with Dr. Lance Muss.  1. Please arrive at the [facility] (Main Entrance A) at [facility]: [address] [place], [place] [address] at 10:00 AM (This time is two hours before your procedure to ensure your preparation). Free valet parking service is available.    Special note: Every effort is made to have your procedure done on time. Please understand that emergencies sometimes delay scheduled procedures.  2. Diet: Do not eat solid foods after midnight.  The patient may have clear liquids until 5am upon the day of the procedure.  3. Labs: You will need to have blood drawn on TODAY   4. Medication instructions in preparation for your procedure:   Contrast Allergy: No   On the morning of your procedure, take your Aspirin 81 mg and any morning medicines NOT listed above.  You may use sips of water.  5. Plan for one night stay--bring personal belongings. 6. Bring a current list of your medications and current insurance cards. 7. You MUST have a responsible person to drive you home. 8. Someone MUST be with you the first 24 hours after you arrive home or your discharge will be delayed. 9. Please wear clothes that are easy to get on and off and wear slip-on shoes.  Thank you for allowing [place] to care for you!   -- [facility] [place] [facility]     Follow-Up: At [facility], you and your health needs are our priority.  As part of our continuing mission to provide you with exceptional heart care, we have created designated Provider Care Teams.  These Care Teams include your primary Cardiologist (physician) and Advanced Practice Providers (APPs -  Physician Assistants and Nurse Practitioners) who all work together to provide you with the care you need, when you need it.  We recommend signing up for the patient portal called "MyChart".  Sign up  information is provided on this After Visit Summary.  MyChart is used to connect with patients for Virtual Visits (Telemedicine).  Patients are able to view lab/test results, encounter notes, upcoming appointments, etc.  Non-urgent messages can be sent to your provider as well.   To learn more about what you can do with MyChart, go to [email or web address].    Your next appointment:    2 week(s)  05/27/22 1:30  The format for your next appointment:   In Person  Provider:   [name], MD  or Ambrose Pancoast, NP         Other Instructions   Important Information About Sugar

## 2022-05-06 LAB — BASIC METABOLIC PANEL
BUN/Creatinine Ratio: 8 — ABNORMAL LOW (ref 10–24)
BUN: 8 mg/dL (ref 8–27)
CO2: 19 mmol/L — ABNORMAL LOW (ref 20–29)
Calcium: 9 mg/dL (ref 8.6–10.2)
Chloride: 105 mmol/L (ref 96–106)
Creatinine, Ser: 0.96 mg/dL (ref 0.76–1.27)
Glucose: 89 mg/dL (ref 70–99)
Potassium: 4.2 mmol/L (ref 3.5–5.2)
Sodium: 138 mmol/L (ref 134–144)
eGFR: 90 mL/min/{1.73_m2} (ref >59)

## 2022-05-06 LAB — CBC
Hematocrit: 47.1 % (ref 37.5–51.0)
Hemoglobin: 16.6 g/dL (ref 13.0–17.7)
MCH: 33.5 pg — ABNORMAL HIGH (ref 26.6–33.0)
MCHC: 35.2 g/dL (ref 31.5–35.7)
MCV: 95 fL (ref 79–97)
Platelets: 191 10*3/uL (ref 150–450)
RBC: 4.96 x10E6/uL (ref 4.14–5.80)
RDW: 12.5 % (ref 11.6–15.4)
WBC: 7 10*3/uL (ref 3.4–10.8)

## 2022-05-12 ENCOUNTER — Telehealth: Payer: Self-pay | Admitting: *Deleted

## 2022-05-12 NOTE — Telephone Encounter (Signed)
Cardiac Catheterization scheduled at [facility] for: Tuesday May 13, 2022 12 Noon Arrival time and place: [facility] Main Entrance A at: 10 AM  Nothing to eat after midnight prior to procedure, clear liquids until 5 AM day of procedure.  Medication instructions: -Usual morning medications can be taken with sips of water including aspirin 81 mg.  Confirmed patient has responsible adult to drive home post procedure and be with patient first 24 hours after arriving home.  Patient reports no new symptoms concerning for COVID-19 in the past 10 days.  Reviewed procedure instructions with patient.

## 2022-05-13 ENCOUNTER — Other Ambulatory Visit: Payer: Self-pay

## 2022-05-13 ENCOUNTER — Ambulatory Visit (HOSPITAL_COMMUNITY)
Admission: RE | Disposition: A | Discharge status: Home / Self Care | Payer: Self-pay | Source: Ambulatory Visit | Attending: Interventional Cardiology

## 2022-05-13 ENCOUNTER — Ambulatory Visit (HOSPITAL_COMMUNITY)
Admission: RE | Admit: 2022-05-13 | Discharge: 2022-05-13 | Disposition: A | Discharge status: Home / Self Care | Payer: 59 | Source: Ambulatory Visit | Attending: Interventional Cardiology | Admitting: Interventional Cardiology

## 2022-05-13 ENCOUNTER — Other Ambulatory Visit (HOSPITAL_COMMUNITY): Payer: Self-pay

## 2022-05-13 DIAGNOSIS — R072 Precordial pain: Secondary | ICD-10-CM | POA: Diagnosis present

## 2022-05-13 DIAGNOSIS — I25118 Atherosclerotic heart disease of native coronary artery with other forms of angina pectoris: Secondary | ICD-10-CM | POA: Insufficient documentation

## 2022-05-13 DIAGNOSIS — I10 Essential (primary) hypertension: Secondary | ICD-10-CM | POA: Diagnosis not present

## 2022-05-13 DIAGNOSIS — E785 Hyperlipidemia, unspecified: Secondary | ICD-10-CM | POA: Diagnosis not present

## 2022-05-13 DIAGNOSIS — I2510 Atherosclerotic heart disease of native coronary artery without angina pectoris: Secondary | ICD-10-CM

## 2022-05-13 DIAGNOSIS — Z955 Presence of coronary angioplasty implant and graft: Secondary | ICD-10-CM

## 2022-05-13 DIAGNOSIS — R002 Palpitations: Secondary | ICD-10-CM | POA: Insufficient documentation

## 2022-05-13 HISTORY — PX: CORONARY STENT INTERVENTION: CATH118234

## 2022-05-13 HISTORY — PX: LEFT HEART CATH AND CORONARY ANGIOGRAPHY: CATH118249

## 2022-05-13 LAB — POCT ACTIVATED CLOTTING TIME: Activated Clotting Time: 335 seconds

## 2022-05-13 SURGERY — LEFT HEART CATH AND CORONARY ANGIOGRAPHY
Anesthesia: LOCAL

## 2022-05-13 MED ORDER — HYDRALAZINE HCL 20 MG/ML IJ SOLN: 10.0000 mg | INTRAMUSCULAR | Status: AC | PRN

## 2022-05-13 MED ORDER — NITROGLYCERIN 1 MG/10 ML FOR IR/CATH LAB
INTRA_ARTERIAL | Status: DC | PRN
  Administered 2022-05-13: 200 ug via INTRACORONARY
  Administered 2022-05-13: 200 ug via INTRA_ARTERIAL

## 2022-05-13 MED ORDER — HEPARIN SODIUM (PORCINE) 1000 UNIT/ML IJ SOLN
INTRAMUSCULAR | Status: AC
  Filled 2022-05-13: qty 10

## 2022-05-13 MED ORDER — CLOPIDOGREL BISULFATE 300 MG PO TABS
ORAL_TABLET | ORAL | Status: DC | PRN
  Administered 2022-05-13: 600 mg via ORAL

## 2022-05-13 MED ORDER — LIDOCAINE HCL (PF) 1 % IJ SOLN
INTRAMUSCULAR | Status: AC
  Filled 2022-05-13: qty 30

## 2022-05-13 MED ORDER — NITROGLYCERIN 1 MG/10 ML FOR IR/CATH LAB
INTRA_ARTERIAL | Status: AC
  Filled 2022-05-13: qty 10

## 2022-05-13 MED ORDER — SODIUM CHLORIDE 0.9% FLUSH: 3.0000 mL | INTRAVENOUS | Status: DC | PRN

## 2022-05-13 MED ORDER — LABETALOL HCL 5 MG/ML IV SOLN: 10.0000 mg | INTRAVENOUS | Status: AC | PRN

## 2022-05-13 MED ORDER — SODIUM CHLORIDE 0.9 % IV SOLN: INTRAVENOUS | Status: AC

## 2022-05-13 MED ORDER — SODIUM CHLORIDE 0.9 % IV SOLN: 250.0000 mL | INTRAVENOUS | Status: DC | PRN

## 2022-05-13 MED ORDER — LIDOCAINE HCL (PF) 1 % IJ SOLN
INTRAMUSCULAR | Status: DC | PRN
  Administered 2022-05-13: 2 mL

## 2022-05-13 MED ORDER — MIDAZOLAM HCL 2 MG/2ML IJ SOLN
INTRAMUSCULAR | Status: AC
  Filled 2022-05-13: qty 2

## 2022-05-13 MED ORDER — SODIUM CHLORIDE 0.9% FLUSH: 3.0000 mL | Freq: Two times a day (BID) | INTRAVENOUS | Status: DC

## 2022-05-13 MED ORDER — HEPARIN (PORCINE) IN NACL 1000-0.9 UT/500ML-% IV SOLN
INTRAVENOUS | Status: DC | PRN
  Administered 2022-05-13 (×2): 500 mL

## 2022-05-13 MED ORDER — SODIUM CHLORIDE 0.9 % WEIGHT BASED INFUSION
3.0000 mL/kg/h | INTRAVENOUS | Status: AC
  Administered 2022-05-13: 3 mL/kg/h via INTRAVENOUS

## 2022-05-13 MED ORDER — ROSUVASTATIN CALCIUM 40 MG PO TABS: 40.0000 mg | ORAL_TABLET | Freq: Every day | ORAL | 1 refills | Status: DC

## 2022-05-13 MED ORDER — MIDAZOLAM HCL 2 MG/2ML IJ SOLN
INTRAMUSCULAR | Status: DC | PRN
  Administered 2022-05-13: 1 mg via INTRAVENOUS
  Administered 2022-05-13: 2 mg via INTRAVENOUS

## 2022-05-13 MED ORDER — FENTANYL CITRATE (PF) 100 MCG/2ML IJ SOLN
INTRAMUSCULAR | Status: DC | PRN
  Administered 2022-05-13 (×2): 25 ug via INTRAVENOUS

## 2022-05-13 MED ORDER — HEPARIN (PORCINE) IN NACL 1000-0.9 UT/500ML-% IV SOLN
INTRAVENOUS | Status: AC
  Filled 2022-05-13: qty 1000

## 2022-05-13 MED ORDER — CLOPIDOGREL BISULFATE 75 MG PO TABS
75.0000 mg | ORAL_TABLET | Freq: Every day | ORAL | 11 refills | Status: DC
  Filled 2022-05-13: qty 30, 30d supply, fill #0

## 2022-05-13 MED ORDER — ONDANSETRON HCL 4 MG/2ML IJ SOLN: 4.0000 mg | Freq: Four times a day (QID) | INTRAMUSCULAR | Status: DC | PRN

## 2022-05-13 MED ORDER — HEPARIN SODIUM (PORCINE) 1000 UNIT/ML IJ SOLN
INTRAMUSCULAR | Status: DC | PRN
  Administered 2022-05-13: 9000 [IU] via INTRAVENOUS
  Administered 2022-05-13: 2000 [IU] via INTRAVENOUS
  Administered 2022-05-13: 6000 [IU] via INTRAVENOUS

## 2022-05-13 MED ORDER — FENTANYL CITRATE (PF) 100 MCG/2ML IJ SOLN
INTRAMUSCULAR | Status: AC
  Filled 2022-05-13: qty 2

## 2022-05-13 MED ORDER — ASPIRIN 81 MG PO CHEW: 81.0000 mg | CHEWABLE_TABLET | ORAL | Status: DC

## 2022-05-13 MED ORDER — CLOPIDOGREL BISULFATE 300 MG PO TABS
ORAL_TABLET | ORAL | Status: AC
  Filled 2022-05-13: qty 2

## 2022-05-13 MED ORDER — ACETAMINOPHEN 325 MG PO TABS: 650.0000 mg | ORAL_TABLET | ORAL | Status: DC | PRN

## 2022-05-13 MED ORDER — CLOPIDOGREL BISULFATE 75 MG PO TABS: 75.0000 mg | ORAL_TABLET | Freq: Every day | ORAL | Status: DC

## 2022-05-13 MED ORDER — ASPIRIN 81 MG PO CHEW: 81.0000 mg | CHEWABLE_TABLET | Freq: Every day | ORAL | Status: DC

## 2022-05-13 MED ORDER — VERAPAMIL HCL 2.5 MG/ML IV SOLN
INTRAVENOUS | Status: AC
  Filled 2022-05-13: qty 2

## 2022-05-13 MED ORDER — VERAPAMIL HCL 2.5 MG/ML IV SOLN: INTRAVENOUS | Status: DC | PRN

## 2022-05-13 MED ORDER — SODIUM CHLORIDE 0.9 % WEIGHT BASED INFUSION: 1.0000 mL/kg/h | INTRAVENOUS | Status: DC

## 2022-05-13 SURGICAL SUPPLY — 19 items
BALL SAPPHIRE NC24 3.75X12 (BALLOONS) ×1
BALLN SAPPHIRE 2.5X12 (BALLOONS) ×1
BALLOON SAPPHIRE 2.5X12 (BALLOONS) IMPLANT
BALLOON SAPPHIRE NC24 3.75X12 (BALLOONS) IMPLANT
CATH 5FR JL3.5 JR4 ANG PIG MP (CATHETERS) IMPLANT
CATH LAUNCHER 6FR JR4 (CATHETERS) IMPLANT
DEVICE RAD COMP TR BAND LRG (VASCULAR PRODUCTS) IMPLANT
GLIDESHEATH SLEND SS 6F .021 (SHEATH) IMPLANT
GUIDEWIRE INQWIRE 1.5J.035X260 (WIRE) IMPLANT
INQWIRE 1.5J .035X260CM (WIRE) ×1
KIT ENCORE 26 ADVANTAGE (KITS) IMPLANT
KIT HEART LEFT (KITS) ×1 IMPLANT
KIT HEMO VALVE WATCHDOG (MISCELLANEOUS) IMPLANT
PACK CARDIAC CATHETERIZATION (CUSTOM PROCEDURE TRAY) ×1 IMPLANT
STENT SYNERGY XD 3.50X20 (Permanent Stent) IMPLANT
SYNERGY XD 3.50X20 (Permanent Stent) ×1 IMPLANT
TRANSDUCER W/STOPCOCK (MISCELLANEOUS) ×1 IMPLANT
TUBING CIL FLEX 10 FLL-RA (TUBING) ×1 IMPLANT
WIRE ASAHI PROWATER 180CM (WIRE) IMPLANT

## 2022-05-13 NOTE — Discharge Instructions (Signed)

## 2022-05-13 NOTE — Discharge Summary (Cosign Needed)
Discharge Summary for Same Day PCI   Patient ID: ELDRA WORD MRN: [id number]; DOB: Feb 05, 1961  Admit date: 05/13/2022 Discharge date: 05/13/2022  Primary Care Provider: Pcp, No  Primary Cardiologist: [name], MD  Primary Electrophysiologist:  None   Discharge Diagnoses    Principal Problem:   Precordial chest pain Active Problems:   Hyperlipidemia   Diagnostic Studies/Procedures    Cardiac Catheterization 05/13/2022:    Dist RCA lesion is 90% stenosed.   A drug-eluting stent was successfully placed using a SYNERGY XD 3.50X20.   Post intervention, there is a 0% residual stenosis.   The left ventricular systolic function is normal.   LV end diastolic pressure is normal.   The left ventricular ejection fraction is 55-65% by visual estimate.   There is no aortic valve stenosis.   Continue aggressive secondary prevention along with DAPT.      Plan for same day discharge.   Diagnostic Dominance: Right  Intervention   _____________   History of Present Illness     MARVELOUS BOUWENS is a 61 y.o. male with PMH of nonobstructive CAD s/p cardiac CTA 03/2022 distal lesion per FFR stable angina, palpitations. He was initially seen by Dr. Irish Lack on 02/2022 for consultation by PCP for complaint of chest pain and palpitations.  Patient reported feelings of skipped beat that also have occasional fluttering.  Patient had stress testing completed in 2013 at [facility] that was negative. Patient reported chest discomfort and tightness during intercourse and worse with activity.  Cardiac CTA was completed and revealed distal lesion in RCA measuring 70-90% that is flow-limiting per FFR with no significant calcium located in other arteries.  He was started on Ranexa 500 mg twice daily for chest pain.  Patient was advised to not use with Viagra.  Patient had to take medication twice recently and also advised to take as needed nitroglycerin for persistent pain.  Patient presented to the office  for event monitor placement to evaluate palpitations further.   At his last office visit reported that he was still experiencing chest discomfort with intercourse and exercise. He stated that the pain occurs and lasts for at least an hour and he becomes panicked and gets sweaty. Cardiac catheterization was arranged for further evaluation.  Hospital Course     The patient underwent cardiac cath as noted above with distal RCA lesion of 90% treated with PCI/DES x1. Plan for DAPT with ASA/Plavix for at least 6 months. The patient was seen by cardiac rehab while in short stay. There were no observed complications post cath. Radial cath site was re-evaluated prior to discharge and found to be stable without any complications. Instructions/precautions regarding cath site care were given prior to discharge.  Oneita Hurt was seen by Dr. Irish Lack and determined stable for discharge home. Follow up with our office has been arranged. Medications are listed below. Pertinent changes include Plavix, Crestor 40 mg daily.  _____________  Cath/PCI Registry Performance & Quality Measures: Aspirin prescribed? - Yes ADP Receptor Inhibitor (Plavix/Clopidogrel, Brilinta/Ticagrelor or Effient/Prasugrel) prescribed (includes medically managed patients)? - Yes High Intensity Statin (Lipitor 40-80mg  or Crestor 20-40mg ) prescribed? - Yes For EF <40%, was ACEI/ARB prescribed? - Not Applicable (EF >/= 37%) For EF <40%, Aldosterone Antagonist (Spironolactone or Eplerenone) prescribed? - Not Applicable (EF >/= 16%) Cardiac Rehab Phase II ordered (Included Medically managed Patients)? - Yes  _____________   Discharge Vitals Blood pressure 129/61, pulse 61, temperature 97.6 F (36.4 C), temperature source Temporal, resp. rate Marland Kitchen)  23, height $RemoveBe'6\' 4"'FRVNBSAGU$  (1.93 m), weight (!) 147.4 kg, SpO2 98 %.  Filed Weights   05/13/22 1030  Weight: (!) 147.4 kg    Last Labs & Radiologic Studies    CBC No results for input(s): "WBC",  "NEUTROABS", "HGB", "HCT", "MCV", "PLT" in the last 72 hours. Basic Metabolic Panel No results for input(s): "NA", "K", "CL", "CO2", "GLUCOSE", "BUN", "CREATININE", "CALCIUM", "MG", "PHOS" in the last 72 hours. Liver Function Tests No results for input(s): "AST", "ALT", "ALKPHOS", "BILITOT", "PROT", "ALBUMIN" in the last 72 hours. No results for input(s): "LIPASE", "AMYLASE" in the last 72 hours. High Sensitivity Troponin:   No results for input(s): "TROPONINIHS" in the last 720 hours.  BNP Invalid input(s): "POCBNP" D-Dimer No results for input(s): "DDIMER" in the last 72 hours. Hemoglobin A1C No results for input(s): "HGBA1C" in the last 72 hours. Fasting Lipid Panel No results for input(s): "CHOL", "HDL", "LDLCALC", "TRIG", "CHOLHDL", "LDLDIRECT" in the last 72 hours. Thyroid Function Tests No results for input(s): "TSH", "T4TOTAL", "T3FREE", "THYROIDAB" in the last 72 hours.  Invalid input(s): "FREET3" _____________  CARDIAC CATHETERIZATION  Result Date: 05/13/2022   Dist RCA lesion is 90% stenosed.   A drug-eluting stent was successfully placed using a SYNERGY XD 3.50X20.   Post intervention, there is a 0% residual stenosis.   The left ventricular systolic function is normal.   LV end diastolic pressure is normal.   The left ventricular ejection fraction is 55-65% by visual estimate.   There is no aortic valve stenosis. Continue aggressive secondary prevention along with DAPT.   Plan for same day discharge.    Disposition   Pt is being discharged home today in good condition.  Follow-up Plans & Appointments     Follow-up Information     Barbarann Ehlers Junius Creamer., NP Follow up on 05/27/2022.   Specialty: Nurse Practitioner Why: at 1:30pm for your follow up appt with Dr. Irish Lack' NP Theotis Barrio information: [address] [place] [address] [place] [address] [phone]                Discharge Instructions     Amb Referral to Cardiac Rehabilitation    Complete by: As directed    Diagnosis: Coronary Stents   After initial evaluation and assessments completed: Virtual Based Care may be provided alone or in conjunction with Phase 2 Cardiac Rehab based on patient barriers.: Yes   Intensive Cardiac Rehabilitation (ICR) [place] location only OR Traditional Cardiac Rehabilitation (TCR) *If criteria for ICR are not met will enroll in TCR [facility] only): Yes        Discharge Medications   Allergies as of 05/13/2022       Reactions   Neosporin [bacitracin-polymyxin B] Rash, Other (See Comments)   Blisters (at area applied only)   Penicillins Rash        Medication List     STOP taking these medications    metoprolol tartrate 100 MG tablet Commonly known as: LOPRESSOR   naproxen sodium 220 MG tablet Commonly known as: ALEVE       TAKE these medications    amLODipine-valsartan 10-320 MG tablet Commonly known as: EXFORGE Take 1 tablet by mouth daily.   aspirin EC 81 MG tablet Take 81 mg by mouth daily. Swallow whole.   B-complex with vitamin C tablet Take 1 tablet by mouth daily.   clopidogrel 75 MG tablet Commonly known as: PLAVIX Take 1 tablet (75 mg total) by mouth daily with breakfast. Start taking on: May 14, 2022   nitroGLYCERIN 0.4 MG SL tablet Commonly known as: NITROSTAT Place 1 tablet (0.4 mg total) under the tongue every 5 (five) minutes as needed for chest pain.   ranolazine 500 MG 12 hr tablet Commonly known as: RANEXA Take 1 tablet (500 mg total) by mouth 2 (two) times daily.   rosuvastatin 40 MG tablet Commonly known as: Crestor Take 1 tablet (40 mg total) by mouth daily.   sildenafil 50 MG tablet Commonly known as: VIAGRA Take 50 mg by mouth daily as needed for erectile dysfunction.   testosterone enanthate 200 MG/ML injection Commonly known as: DELATESTRYL Inject into the muscle every 14 (fourteen) days. For IM use only   VITAMIN D (CHOLECALCIFEROL) PO Take 2,500 Units by mouth daily.            Allergies Allergies  Allergen Reactions   Neosporin [Bacitracin-Polymyxin B] Rash and Other (See Comments)    Blisters (at area applied only)   Penicillins Rash    Outstanding Labs/Studies   FLP/LFTs in 8 weeks  Duration of Discharge Encounter   Greater than 30 minutes including physician time.  Signed, Reino Bellis, NP 05/13/2022, 4:54 PM  I have examined the patient and reviewed assessment and plan and discussed with patient.  Agree with above as stated.    Stable for sam day discharge.  Stressed the importance of antiplatelet therapy. He would benefit from secondary prevention including weight loss.   [name]

## 2022-05-13 NOTE — Progress Notes (Signed)
Seen pt from 1310-1335 pt was educated on stent procedure,  restrictions, Aspirin & Plavix med use, ex guidelines, NTG use, heart healthy diet, and CRPII. Pt is being referred to CRPII at [facility].   Brett Lowe 05/13/2022 1:36 PM

## 2022-05-13 NOTE — Interval H&P Note (Signed)
Cath Lab Visit (complete for each Cath Lab visit)  Clinical Evaluation Leading to the Procedure:   ACS: No.  Non-ACS:    Anginal Classification: CCS III  Anti-ischemic medical therapy: Minimal Therapy (1 class of medications)  Non-Invasive Test Results: Intermediate-risk stress test findings: cardiac mortality 1-3%/year  Prior CABG: No previous CABG      History and Physical Interval Note:  05/13/2022 11:11 AM  Brett Lowe  has presented today for surgery, with the diagnosis of abnormal cardiac ct, chest pain.  The various methods of treatment have been discussed with the patient and family. After consideration of risks, benefits and other options for treatment, the patient has consented to  Procedure(s): LEFT HEART CATH AND CORONARY ANGIOGRAPHY (N/A) as a surgical intervention.  The patient's history has been reviewed, patient examined, no change in status, stable for surgery.  I have reviewed the patient's chart and labs.  Questions were answered to the patient's satisfaction.     Brett Lowe

## 2022-05-14 ENCOUNTER — Encounter (HOSPITAL_COMMUNITY): Payer: Self-pay | Admitting: Interventional Cardiology

## 2022-05-14 MED FILL — Heparin Sodium (Porcine) Inj 1000 Unit/ML: INTRAMUSCULAR | Qty: 10 | Status: AC

## 2022-05-14 NOTE — Progress Notes (Signed)
11:28 Patients voice mail box was full. Not able to leave a message.

## 2022-05-25 NOTE — Progress Notes (Unsigned)
Office Visit    Patient Name: Brett Lowe Date of Encounter: 05/25/2022  Primary Care Provider:  Pcp, No Primary Cardiologist:  [name], MD Primary Electrophysiologist: None  Chief Complaint    Brett Lowe is a 61 y.o. male with PMH of nonobstructive CAD s/p cardiac CTA 03/2022 distal lesion per FFR stable angina, palpitations who presents today for 4-week follow-up of palpitations.  Past Medical History    Past Medical History:  Diagnosis Date   Abnormal hemoglobin (HCC)    Abnormal thyroid function test    Acute bronchitis    Adjustment disorder    Allergic rhinitis    Arthralgia of multiple joints    Arthritis of left knee    BPH (benign prostatic hyperplasia)    Calculus of kidney    Chronic sinusitis    Constipation    Dysphagia, pharyngoesophageal phase    ED (erectile dysfunction)    Elevated blood pressure reading    Family history of lupus erythematosus    Family history of polymyalgia rheumatica    Family history of rheumatoid arthritis    Gastroenteritis    History of COVID-19    HTN (hypertension)    Hypogonadism in male    Male erectile dysfunction, unspecified    Morbid obesity ([place])    Morbidly obese ([place])    Myalgia    Pharyngitis    Polyarthralgia    Radiculopathy    Sleep apnea    Testicular hypofunction    Vitamin D deficiency    Past Surgical History:  Procedure Laterality Date   CORONARY STENT INTERVENTION N/A 05/13/2022   Procedure: CORONARY STENT INTERVENTION;  Surgeon: Jettie Booze, MD;  Location: [facility];  Service: Cardiovascular;  Laterality: N/A;   LEFT HEART CATH AND CORONARY ANGIOGRAPHY N/A 05/13/2022   Procedure: LEFT HEART CATH AND CORONARY ANGIOGRAPHY;  Surgeon: Jettie Booze, MD;  Location: [facility];  Service: Cardiovascular;  Laterality: N/A;    Allergies  Allergies  Allergen Reactions   Neosporin [Bacitracin-Polymyxin B] Rash and Other (See Comments)    Blisters (at area  applied only)   Penicillins Rash    History of Present Illness    Brett Lowe  is a 61 year old male with the above mention past medical history who presents today for complaint of increased palpitations.  He was initially seen by Dr. Irish Lack on 02/2022 for consultation by PCP for complaint of chest pain and palpitations.  Patient endorsed feelings of skipped beat that also have occasional fluttering.  Patient had stress testing completed in 2013 at [facility] that was negative.  Patient does endorse chest discomfort and tightness during intercourse and worse with activity.  Cardiac CTA was completed and revealed distal lesion in RCA measuring 70-90% that is flow-limiting per FFR with no significant calcium located in other arteries.  He was started on Ranexa 500 mg twice daily for chest pain.  Patient was advised to not use with Viagra.  Patient has had to take medication twice recently and also advised to take as needed nitroglycerin for persistent pain.  Patient presents today for event monitor placement to evaluate palpitations further.  During visit on 05/05/2022 patient reported chest discomfort with intercourse and exercise.  Patient also endorsed pain traveling down his left arm.  Blood pressures were slightly elevated 142/76 during visit.  Case was discussed with DOD Dr. Irish Lack who is also his primary cardiologist.  He was sent for [facility] to rule  out ischemia related to previous cardiac CT results.  LHC performed and revealed 90% distal RCA that was treated with DES x1, patient had no aortic valve stenosis and EF was estimated at 55 to 60%.  He was started on DAPT with Plavix and ASA 81 mg and was discharged same day.  Since last being seen in the office patient reports***.  Patient denies chest pain, palpitations, dyspnea, PND, orthopnea, nausea, vomiting, dizziness, syncope, edema, weight gain, or early satiety.   ***Notes:  Home Medications    Current Outpatient Medications  Medication Sig  Dispense Refill   amLODipine-valsartan (EXFORGE) 10-320 MG tablet Take 1 tablet by mouth daily. 90 tablet 3   aspirin EC 81 MG tablet Take 81 mg by mouth daily. Swallow whole.     B Complex-C (B-COMPLEX WITH VITAMIN C) tablet Take 1 tablet by mouth daily.     clopidogrel (PLAVIX) 75 MG tablet Take 1 tablet (75 mg total) by mouth daily with breakfast. 30 tablet 11   nitroGLYCERIN (NITROSTAT) 0.4 MG SL tablet Place 1 tablet (0.4 mg total) under the tongue every 5 (five) minutes as needed for chest pain. 25 tablet 6   ranolazine (RANEXA) 500 MG 12 hr tablet Take 1 tablet (500 mg total) by mouth 2 (two) times daily. 60 tablet 11   rosuvastatin (CRESTOR) 40 MG tablet Take 1 tablet (40 mg total) by mouth daily. 90 tablet 1   sildenafil (VIAGRA) 50 MG tablet Take 50 mg by mouth daily as needed for erectile dysfunction.     testosterone enanthate (DELATESTRYL) 200 MG/ML injection Inject into the muscle every 14 (fourteen) days. For IM use only     VITAMIN D, CHOLECALCIFEROL, PO Take 2,500 Units by mouth daily.     No current facility-administered medications for this visit.     Review of Systems  Please see the history of present illness.    (+)*** (+)***  All other systems reviewed and are otherwise negative except as noted above.  Physical Exam    Wt Readings from Last 3 Encounters:  05/13/22 (!) 325 lb (147.4 kg)  05/05/22 (!) 325 lb (147.4 kg)  03/05/22 (!) 332 lb (150.6 kg)   IR:JJOAC were no vitals filed for this visit.,There is no height or weight on file to calculate BMI.  Constitutional:      Appearance: Healthy appearance. Not in distress.  Neck:     Vascular: JVD normal.  Pulmonary:     Effort: Pulmonary effort is normal.     Breath sounds: No wheezing. No rales. Diminished in the bases Cardiovascular:     Normal rate. Regular rhythm. Normal S1. Normal S2.      Murmurs: There is no murmur.  Edema:    Peripheral edema absent.  Abdominal:     Palpations: Abdomen is soft  non tender. There is no hepatomegaly.  Skin:    General: Skin is warm and dry.  Neurological:     General: No focal deficit present.     Mental Status: Alert and oriented to person, place and time.     Cranial Nerves: Cranial nerves are intact.  EKG/LABS/Other Studies Reviewed    ECG personally reviewed by me today - ***  Risk Assessment/Calculations:   {Does this patient have ATRIAL FIBRILLATION?:[phone]}        Lab Results  Component Value Date   WBC 7.0 05/05/2022   HGB 16.6 05/05/2022   HCT 47.1 05/05/2022   MCV 95 05/05/2022   PLT 191 05/05/2022  Lab Results  Component Value Date   CREATININE 0.96 05/05/2022   BUN 8 05/05/2022   NA 138 05/05/2022   K 4.2 05/05/2022   CL 105 05/05/2022   CO2 19 (L) 05/05/2022   No results found for: "ALT", "AST", "GGT", "ALKPHOS", "BILITOT" No results found for: "CHOL", "HDL", "LDLCALC", "LDLDIRECT", "TRIG", "CHOLHDL"  No results found for: "HGBA1C"  Assessment & Plan    1. Coronary artery disease: -Cardiac CTA completed on 04/01/2022 that showed occlusive distal lesion and RCA. -Patient underwent LHC on 9/18 that revealed 90% distal RCA that was treated with DES x 1 and was started on DAPT with Plavix 75 mg and ASA 81 mg daily -Today patient reports*** -Continue current GDMT with Crestor 40 mg daily, Ranexa 500 mg twice daily, Plavix 75 mg daily and ASA 81 mg daily   2.  Essential hypertension: -Patient's blood pressure today was*** -Continue Exforge 10-320 mg daily  3.  Hyperlipidemia: -Patient's last LDL was*** -Continue Crestor 40 mg daily  4.***  {The patient has an active order for outpatient cardiac rehabilitation.   Please indicate if the patient is ready to start. Do NOT delete this.  It will auto delete.  Refresh note, then sign.              Click here to document readiness and see contraindications.  :1}  Cardiac Rehabilitation Eligibility Assessment       Disposition: Follow-up with Lance Muss, MD or APP in *** months {Are you ordering a CV Procedure (e.g. stress test, cath, DCCV, TEE, etc)?   Press F2        :[id number]}   Medication Adjustments/Labs and Tests Ordered: Current medicines are reviewed at length with the patient today.  Concerns regarding medicines are outlined above.   Signed, Napoleon Form, Leodis Rains, NP 05/25/2022, 12:53 PM [facility]  Note:  This document was prepared using Dragon voice recognition software and may include unintentional dictation errors.

## 2022-05-27 ENCOUNTER — Encounter: Payer: Self-pay | Admitting: Nurse Practitioner

## 2022-05-27 ENCOUNTER — Ambulatory Visit: Payer: 59 | Attending: Nurse Practitioner | Admitting: Nurse Practitioner

## 2022-05-27 VITALS — BP 122/78 | HR 63 | Ht 76.0 in | Wt 325.0 lb

## 2022-05-27 DIAGNOSIS — I10 Essential (primary) hypertension: Secondary | ICD-10-CM | POA: Diagnosis not present

## 2022-05-27 DIAGNOSIS — Z955 Presence of coronary angioplasty implant and graft: Secondary | ICD-10-CM | POA: Diagnosis not present

## 2022-05-27 DIAGNOSIS — E785 Hyperlipidemia, unspecified: Secondary | ICD-10-CM

## 2022-05-27 NOTE — Patient Instructions (Signed)
Medication Instructions:   Your physician recommends that you continue on your current medications as directed. Please refer to the Current Medication list given to you today.   *If you need a refill on your cardiac medications before your next appointment, please call your pharmacy*   Lab Work:  None ordered.  If you have labs (blood work) drawn today and your tests are completely normal, you will receive your results only by: [place] (if you have MyChart) OR A paper copy in the mail If you have any lab test that is abnormal or we need to change your treatment, we will call you to review the results.   Testing/Procedures:  None ordered.   Follow-Up: At [facility], you and your health needs are our priority.  As part of our continuing mission to provide you with exceptional heart care, we have created designated Provider Care Teams.  These Care Teams include your primary Cardiologist (physician) and Advanced Practice Providers (APPs -  Physician Assistants and Nurse Practitioners) who all work together to provide you with the care you need, when you need it.  We recommend signing up for the patient portal called "MyChart".  Sign up information is provided on this After Visit Summary.  MyChart is used to connect with patients for Virtual Visits (Telemedicine).  Patients are able to view lab/test results, encounter notes, upcoming appointments, etc.  Non-urgent messages can be sent to your provider as well.   To learn more about what you can do with MyChart, go to [email or web address].    Your next appointment:   1 month(s)  The format for your next appointment:   In Person  Provider:   Ambrose Pancoast, NP         Other Instructions  HOW TO TAKE YOUR BLOOD PRESSURE: Rest 5 minutes before taking your blood pressure.  Don't smoke or drink caffeinated beverages for at least 30 minutes before. Take your blood pressure before (not after) you eat. Sit comfortably  with your back supported and both feet on the floor (don't cross your legs). Elevate your arm to heart level on a table or a desk. Use the proper sized cuff. It should fit smoothly and snugly around your bare upper arm. There should be enough room to slip a fingertip under the cuff. The bottom edge of the cuff should be 1 inch above the crease of the elbow. Please monitor your blood pressure and if your blood pressure consistently remains above 130 on the top or 80 on the bottom x3 please call our office at [phone] or send a MyChart message  Adopting a Healthy Lifestyle.   Weight: Know what a healthy weight is for you (roughly BMI <25) and aim to maintain this. You can calculate your body mass index on your smart phone  Diet: Aim for 7+ servings of fruits and vegetables daily Limit animal fats in diet for cholesterol and heart health - choose grass fed whenever available Avoid highly processed foods (fast food burgers, tacos, fried chicken, pizza, hot dogs, french fries)  Saturated fat comes in the form of butter, lard, coconut oil, margarine, partially hydrogenated oils, and fat in meat. These increase your risk of cardiovascular disease.  Use healthy plant oils, such as olive, canola, soy, corn, sunflower and peanut.  Whole foods such as fruits, vegetables and whole grains have fiber  Men need > 38 grams of fiber per day Women need > 25 grams of fiber per day  Load up on vegetables  and fruits - one-half of your plate: Aim for color and variety, and remember that potatoes dont count. Go for whole grains - one-quarter of your plate: Whole wheat, barley, wheat berries, quinoa, oats, brown rice, and foods made with them. If you want pasta, go with whole wheat pasta. Protein power - one-quarter of your plate: Fish, chicken, beans, and nuts are all healthy, versatile protein sources. Limit red meat. You need carbohydrates for energy! The type of carbohydrate is more important than the amount.  Choose carbohydrates such as vegetables, fruits, whole grains, beans, and nuts in the place of white rice, white pasta, potatoes (baked or fried), macaroni and cheese, cakes, cookies, and donuts.  If youre thirsty, drink water. Coffee and tea are good in moderation, but skip sugary drinks and limit milk and dairy products to one or two daily servings. Keep sugar intake at 6 teaspoons or 24 grams or LESS       Exercise: Aim for 150 min of moderate intensity exercise weekly for heart health, and weights twice weekly for bone health Stay active - any steps are better than no steps! Aim for 7-9 hours of sleep daily        Important Information About Sugar

## 2022-06-12 ENCOUNTER — Other Ambulatory Visit (HOSPITAL_COMMUNITY): Payer: Self-pay

## 2022-06-30 NOTE — Progress Notes (Unsigned)
Office Visit    Patient Name: Brett Lowe Date of Encounter: 07/01/2022  Primary Care Provider:  Pcp, No Primary Cardiologist:  Lance Muss, MD Primary Electrophysiologist: None  Chief Complaint   Brett Lowe is a 61 y.o. male with PMH of nonobstructive CAD s/p cardiac CTA 03/2022 distal lesion per FFR stable angina, palpitations who presents today for 4-week follow-up of palpitations.   Past Medical History    Past Medical History:  Diagnosis Date   Abnormal hemoglobin (HCC)    Abnormal thyroid function test    Acute bronchitis    Adjustment disorder    Allergic rhinitis    Arthralgia of multiple joints    Arthritis of left knee    BPH (benign prostatic hyperplasia)    Calculus of kidney    Chronic sinusitis    Constipation    Dysphagia, pharyngoesophageal phase    ED (erectile dysfunction)    Elevated blood pressure reading    Family history of lupus erythematosus    Family history of polymyalgia rheumatica    Family history of rheumatoid arthritis    Gastroenteritis    History of COVID-19    HTN (hypertension)    Hypogonadism in male    Male erectile dysfunction, unspecified    Morbid obesity (HCC)    Morbidly obese (HCC)    Myalgia    Pharyngitis    Polyarthralgia    Radiculopathy    Sleep apnea    Testicular hypofunction    Vitamin D deficiency    Past Surgical History:  Procedure Laterality Date   CORONARY STENT INTERVENTION N/A 05/13/2022   Procedure: CORONARY STENT INTERVENTION;  Surgeon: Corky Crafts, MD;  Location: [facility];  Service: Cardiovascular;  Laterality: N/A;   LEFT HEART CATH AND CORONARY ANGIOGRAPHY N/A 05/13/2022   Procedure: LEFT HEART CATH AND CORONARY ANGIOGRAPHY;  Surgeon: Corky Crafts, MD;  Location: [facility];  Service: Cardiovascular;  Laterality: N/A;    Allergies  Allergies  Allergen Reactions   Neosporin [Bacitracin-Polymyxin B] Rash and Other (See Comments)    Blisters (at area  applied only)   Penicillins Rash    History of Present Illness    Brett Lowe  is a 61 year old male with the above mention past medical history who presents today for complaint of increased palpitations.  He was initially seen by Dr. Eldridge Lowe on 02/2022 for consultation by PCP for complaint of chest pain and palpitations.  Patient endorsed feelings of skipped beat that also have occasional fluttering.  Patient had stress testing completed in 2013 at [facility] that was negative.  Patient does endorse chest discomfort and tightness during intercourse and worse with activity.  Cardiac CTA was completed and revealed distal lesion in RCA measuring 70-90% that is flow-limiting per FFR with no significant calcium located in other arteries.  He was started on Ranexa 500 mg twice daily for chest pain.  Patient was advised to not use with Viagra.  Patient has had to take medication twice recently and also advised to take as needed nitroglycerin for persistent pain.  Patient presents today for event monitor placement to evaluate palpitations further. LHC performed and revealed 90% distal RCA that was treated with DES x1, patient had no aortic valve stenosis and EF was estimated at 55 to 60%.  He was started on DAPT with Plavix and ASA 81 mg and was discharged same day.    [name] presents today for 1 month  follow-up of recent PCI alone.  Since last being seen in the office patient reports that he has been doing well but does note left chest pain that is sore to the touch and associated with increased weight lifting.  He also notes palpitations that he states occur without any rhyme or reason or pattern.  He denied any dizziness or shortness of breath with palpitations.  His blood pressure today was well controlled at 136/80. He is tolerating his current medications without any adverse reactions.  During our visit we discussed the pathophysiology and importance of secondary prevention. Patient denies chest pain,  palpitations, dyspnea, PND, orthopnea, nausea, vomiting, dizziness, syncope, edema, weight gain, or early satiety.    Home Medications    Current Outpatient Medications  Medication Sig Dispense Refill   amLODipine-valsartan (EXFORGE) 10-320 MG tablet Take 1 tablet by mouth daily. 90 tablet 3   aspirin EC 81 MG tablet Take 81 mg by mouth daily. Swallow whole.     augmented betamethasone dipropionate (DIPROLENE-AF) 0.05 % cream Apply topically as needed for rash.     B Complex-C (B-COMPLEX WITH VITAMIN C) tablet Take 1 tablet by mouth as needed.     clopidogrel (PLAVIX) 75 MG tablet Take 1 tablet (75 mg total) by mouth daily with breakfast. 30 tablet 11   mupirocin ointment (BACTROBAN) 2 % Apply topically as needed for rash.     nitroGLYCERIN (NITROSTAT) 0.4 MG SL tablet Place 1 tablet (0.4 mg total) under the tongue every 5 (five) minutes as needed for chest pain. 25 tablet 6   rosuvastatin (CRESTOR) 40 MG tablet Take 1 tablet (40 mg total) by mouth daily. 90 tablet 1   sildenafil (VIAGRA) 50 MG tablet Take 50 mg by mouth daily as needed for erectile dysfunction.     testosterone enanthate (DELATESTRYL) 200 MG/ML injection Inject into the muscle once a week. For IM use only     VITAMIN D, CHOLECALCIFEROL, PO Take 2,500 Units by mouth as needed.     No current facility-administered medications for this visit.     Review of Systems  Please see the history of present illness.    (+) Palpitations (+) Left-sided chest pain  All other systems reviewed and are otherwise negative except as noted above.  Physical Exam    Wt Readings from Last 3 Encounters:  07/01/22 (!) 321 lb (145.6 kg)  05/27/22 (!) 325 lb (147.4 kg)  05/13/22 (!) 325 lb (147.4 kg)   VS: Vitals:   07/01/22 1547  BP: 136/80  Pulse: 82  SpO2: 95%  ,Body mass index is 39.07 kg/m.  Constitutional:      Appearance: Healthy appearance. Not in distress.  Neck:     Vascular: JVD normal.  Pulmonary:     Effort:  Pulmonary effort is normal.     Breath sounds: No wheezing. No rales. Diminished in the bases Cardiovascular:     Normal rate. Regular rhythm. Normal S1. Normal S2.      Murmurs: There is no murmur.  Edema:    Peripheral edema absent.  Abdominal:     Palpations: Abdomen is soft non tender. There is no hepatomegaly.  Skin:    General: Skin is warm and dry.  Neurological:     General: No focal deficit present.     Mental Status: Alert and oriented to person, place and time.     Cranial Nerves: Cranial nerves are intact.  EKG/LABS/Other Studies Reviewed    ECG personally reviewed by me today -none  completed today    Lab Results  Component Value Date   WBC 7.0 05/05/2022   HGB 16.6 05/05/2022   HCT 47.1 05/05/2022   MCV 95 05/05/2022   PLT 191 05/05/2022   Lab Results  Component Value Date   CREATININE 0.96 05/05/2022   BUN 8 05/05/2022   NA 138 05/05/2022   K 4.2 05/05/2022   CL 105 05/05/2022   CO2 19 (L) 05/05/2022   No results found for: "ALT", "AST", "GGT", "ALKPHOS", "BILITOT" No results found for: "CHOL", "HDL", "LDLCALC", "LDLDIRECT", "TRIG", "CHOLHDL"  No results found for: "HGBA1C"  Assessment & Plan    1. Coronary artery disease: -Cardiac CTA completed on 04/01/2022 that showed occlusive distal lesion and RCA. -Patient underwent LHC on 9/18 that revealed 90% distal RCA that was treated with DES x 1 and was started on DAPT with Plavix 75 mg and ASA 81 mg daily -Today patient reports no chest pain similar to his PCI pain however he does note pain that is associated with weight lifting that he states is sharp in nature.  We discussed the importance of following up if this pain is reproducible and continues to occur. -He has no reports of bleeding with DAPT -Continue current GDMT with Crestor 40 mg daily,  Plavix 75 mg daily and ASA 81 mg daily    2.  Essential hypertension: -Patient's blood pressure today was well controlled at 136/80 -Patient advised to check  blood pressures over next 2 weeks and we will reduce medications by half if pressures are remaining below -Continue Exforge 10-320 mg daily   3.  Hyperlipidemia: -We will check lipids and LFTs -Continue Crestor 40 mg daily pending lipid results.   4.  Morbid obesity: -Current BMI is 39.56 kg/m -He was encouraged to increase physical activity to at least 150 minutes/week -Patient advised to continue increase physical activity as tolerated   Disposition: Follow-up with [name], MD or APP in 3 months   Medication Adjustments/Labs and Tests Ordered: Current medicines are reviewed at length with the patient today.  Concerns regarding medicines are outlined above.   Signed, Mable Fill, Marissa Nestle, NP 07/01/2022, 4:04 PM [facility]  Note:  This document was prepared using Dragon voice recognition software and may include unintentional dictation errors.

## 2022-07-01 ENCOUNTER — Encounter: Payer: Self-pay | Admitting: Nurse Practitioner

## 2022-07-01 ENCOUNTER — Ambulatory Visit: Payer: 59 | Attending: Nurse Practitioner | Admitting: Nurse Practitioner

## 2022-07-01 VITALS — BP 136/80 | HR 82 | Ht 76.0 in | Wt 321.0 lb

## 2022-07-01 DIAGNOSIS — I2510 Atherosclerotic heart disease of native coronary artery without angina pectoris: Secondary | ICD-10-CM | POA: Diagnosis not present

## 2022-07-01 DIAGNOSIS — I10 Essential (primary) hypertension: Secondary | ICD-10-CM | POA: Diagnosis not present

## 2022-07-01 DIAGNOSIS — E785 Hyperlipidemia, unspecified: Secondary | ICD-10-CM | POA: Diagnosis not present

## 2022-07-01 DIAGNOSIS — Z6839 Body mass index (BMI) 39.0-39.9, adult: Secondary | ICD-10-CM

## 2022-07-01 NOTE — Patient Instructions (Signed)
Medication Instructions:  Your physician recommends that you continue on your current medications as directed. Please refer to the Current Medication list given to you today. *If you need a refill on your cardiac medications before your next appointment, please call your pharmacy*   Lab Work: 1 WEEK OR [facility]VEN[facility] LFT & LID If you have labs (blood work) drawn today and your tests are completely normal, you will receive your results only by: MyChart Message (if you have MyChart) OR A paper copy in the mail If you have any lab test that is abnormal or we need to change your treatment, we will call you to review the results.   Testing/Procedures: NONE    Follow-Up: At [facility], you and your health needs are our priority.  As part of our continuing mission to provide you with exceptional heart care, we have created designated Provider Care Teams.  These Care Teams include your primary Cardiologist (physician) and Advanced Practice Providers (APPs -  Physician Assistants and Nurse Practitioners) who all work together to provide you with the care you need, when you need it.  We recommend signing up for the patient portal called "MyChart".  Sign up information is provided on this After Visit Summary.  MyChart is used to connect with patients for Virtual Visits (Telemedicine).  Patients are able to view lab/test results, encounter notes, upcoming appointments, etc.  Non-urgent messages can be sent to your provider as well.   To learn more about what you can do with MyChart, go to [email or web address].    Your next appointment:   3 month(s)  The format for your next appointment:   In Person  Provider:   Lance Muss, MD     Other Instructions   Important Information About Sugar

## 2022-07-17 ENCOUNTER — Ambulatory Visit: Payer: 59 | Attending: Nurse Practitioner

## 2022-07-17 ENCOUNTER — Other Ambulatory Visit: Payer: Self-pay | Admitting: Internal Medicine

## 2022-07-17 DIAGNOSIS — E785 Hyperlipidemia, unspecified: Secondary | ICD-10-CM

## 2022-07-17 DIAGNOSIS — I2510 Atherosclerotic heart disease of native coronary artery without angina pectoris: Secondary | ICD-10-CM

## 2022-07-17 DIAGNOSIS — F17210 Nicotine dependence, cigarettes, uncomplicated: Secondary | ICD-10-CM

## 2022-07-17 DIAGNOSIS — F17200 Nicotine dependence, unspecified, uncomplicated: Secondary | ICD-10-CM

## 2022-07-17 DIAGNOSIS — I10 Essential (primary) hypertension: Secondary | ICD-10-CM

## 2022-07-17 LAB — HEPATIC FUNCTION PANEL
ALT: 52 IU/L — ABNORMAL HIGH (ref 0–44)
AST: 22 IU/L (ref 0–40)
Albumin: 4.1 g/dL (ref 3.9–4.9)
Alkaline Phosphatase: 73 IU/L (ref 44–121)
Bilirubin Total: 0.2 mg/dL (ref 0.0–1.2)
Bilirubin, Direct: <0.1 mg/dL (ref 0.00–0.40)
Total Protein: 6.9 g/dL (ref 6.0–8.5)

## 2022-07-17 LAB — LIPID PANEL
Chol/HDL Ratio: 5.3 ratio — ABNORMAL HIGH (ref 0.0–5.0)
Cholesterol, Total: 187 mg/dL (ref 100–199)
HDL: 35 mg/dL — ABNORMAL LOW (ref >39)
LDL Chol Calc (NIH): 133 mg/dL — ABNORMAL HIGH (ref 0–99)
Triglycerides: 105 mg/dL (ref 0–149)
VLDL Cholesterol Cal: 19 mg/dL (ref 5–40)

## 2022-08-15 ENCOUNTER — Inpatient Hospital Stay: Admission: RE | Admit: 2022-08-15 | Payer: 59 | Source: Ambulatory Visit

## 2022-09-02 ENCOUNTER — Telehealth: Payer: Self-pay

## 2022-09-02 ENCOUNTER — Ambulatory Visit: Payer: 59 | Attending: Interventional Cardiology

## 2022-09-02 DIAGNOSIS — E785 Hyperlipidemia, unspecified: Secondary | ICD-10-CM

## 2022-09-02 NOTE — Telephone Encounter (Signed)
[facility] lab called Triage, Pt states needed labs drawn today.  No lab orders found in provider note, 07/17/22 Lipid and ALT already resulted.  Upon further investigation, new lab orders were found within the 07/17/22 results, and ordered per provider request.     When taken to Lab, results were received from provider Dr. Koleen Nimrod PCP office of [facility], and CMET / Lipid panel received. No need for [address] lab visit or orders from Mr. Jaquelyn Bitter NP.    Will have these results scanned into Pt chart for Mr. Jaquelyn Bitter NP review.

## 2022-09-18 ENCOUNTER — Ambulatory Visit
Admission: RE | Admit: 2022-09-18 | Discharge: 2022-09-18 | Disposition: A | Discharge status: Home / Self Care | Payer: 59 | Source: Ambulatory Visit | Attending: Internal Medicine | Admitting: Internal Medicine

## 2022-09-18 DIAGNOSIS — F17210 Nicotine dependence, cigarettes, uncomplicated: Secondary | ICD-10-CM

## 2022-10-08 NOTE — Progress Notes (Unsigned)
Cardiology Office Note   Date:  10/09/2022   ID:  Brett Lowe, DOB 1961/08/08, MRN [id number]  PCP:  Kathalene Frames, MD    No chief complaint on file.  CAD  Wt Readings from Last 3 Encounters:  10/09/22 (!) 327 lb (148.3 kg)  07/01/22 (!) 321 lb (145.6 kg)  05/27/22 (!) 325 lb (147.4 kg)       History of Present Illness: Brett Lowe is a 62 y.o. male  Dist RCA lesion is 90% stenosed.   A drug-eluting stent was successfully placed using a SYNERGY XD 3.50X20.   Post intervention, there is a 0% residual stenosis.   The left ventricular systolic function is normal.   LV end diastolic pressure is normal.   The left ventricular ejection fraction is 55-65% by visual estimate.   There is no aortic valve stenosis.   Continue aggressive secondary prevention along with DAPT.      Plan for same day discharge.    Has felt well for the most part.  Noted some leg and shoulder pains after starting rosuvastatin.  Aching legs. Had PMR in the past and was on steroids.  Tapered off aft August follow-up x-ray I think this would er a year. Sx mostly in the shoulders.    Denies : Chest pain. Dizziness. Leg edema. Nitroglycerin use. Orthopnea. Palpitations. Paroxysmal nocturnal dyspnea. Shortness of breath. Syncope.    Past Medical History:  Diagnosis Date   Abnormal hemoglobin (HCC)    Abnormal thyroid function test    Acute bronchitis    Adjustment disorder    Allergic rhinitis    Arthralgia of multiple joints    Arthritis of left knee    BPH (benign prostatic hyperplasia)    Calculus of kidney    Chronic sinusitis    Constipation    Dysphagia, pharyngoesophageal phase    ED (erectile dysfunction)    Elevated blood pressure reading    Family history of lupus erythematosus    Family history of polymyalgia rheumatica    Family history of rheumatoid arthritis    Gastroenteritis    History of COVID-19    HTN (hypertension)    Hypogonadism in male    Male erectile  dysfunction, unspecified    Morbid obesity ([place])    Morbidly obese ([place])    Myalgia    Pharyngitis    Polyarthralgia    Radiculopathy    Sleep apnea    Testicular hypofunction    Vitamin D deficiency     Past Surgical History:  Procedure Laterality Date   CORONARY STENT INTERVENTION N/A 05/13/2022   Procedure: CORONARY STENT INTERVENTION;  Surgeon: Jettie Booze, MD;  Location: [facility];  Service: Cardiovascular;  Laterality: N/A;   LEFT HEART CATH AND CORONARY ANGIOGRAPHY N/A 05/13/2022   Procedure: LEFT HEART CATH AND CORONARY ANGIOGRAPHY;  Surgeon: Jettie Booze, MD;  Location: [facility];  Service: Cardiovascular;  Laterality: N/A;     Current Outpatient Medications  Medication Sig Dispense Refill   amLODipine-valsartan (EXFORGE) 10-320 MG tablet Take 1 tablet by mouth daily. 90 tablet 3   aspirin EC 81 MG tablet Take 81 mg by mouth daily. Swallow whole.     augmented betamethasone dipropionate (DIPROLENE-AF) 0.05 % cream Apply topically as needed for rash.     B Complex-C (B-COMPLEX WITH VITAMIN C) tablet Take 1 tablet by mouth as needed.     clopidogrel (PLAVIX) 75 MG tablet Take 1 tablet (75  mg total) by mouth daily with breakfast. 30 tablet 11   mupirocin ointment (BACTROBAN) 2 % Apply topically as needed for rash.     nitroGLYCERIN (NITROSTAT) 0.4 MG SL tablet Place 1 tablet (0.4 mg total) under the tongue every 5 (five) minutes as needed for chest pain. 25 tablet 6   rosuvastatin (CRESTOR) 40 MG tablet Take 1 tablet (40 mg total) by mouth daily. 90 tablet 1   sildenafil (VIAGRA) 50 MG tablet Take 50 mg by mouth daily as needed for erectile dysfunction.     testosterone enanthate (DELATESTRYL) 200 MG/ML injection Inject into the muscle once a week. For IM use only     VITAMIN D, CHOLECALCIFEROL, PO Take 2,500 Units by mouth as needed.     No current facility-administered medications for this visit.    Allergies:   Neosporin  [bacitracin-polymyxin b] and Penicillins    Social History:  The patient  reports that he has never smoked. He does not have any smokeless tobacco history on file.   Family History:  The patient's family history includes Heart attack in his father; Stroke in his brother.    ROS:  Please see the history of present illness.   Otherwise, review of systems are positive for .   All other systems are reviewed and negative.    PHYSICAL EXAM: VS:  BP 124/80   Pulse 81   Ht 6' 4"$  (1.93 m)   Wt (!) 327 lb (148.3 kg)   SpO2 93%   BMI 39.80 kg/m  , BMI Body mass index is 39.8 kg/m. GEN: Well nourished, well developed, in no acute distress HEENT: normal Neck: no JVD, carotid bruits, or masses Cardiac: RRR; no murmurs, rubs, or gallops,no edema  Respiratory:  clear to auscultation bilaterally, normal work of breathing GI: soft, nontender, nondistended, + BS, obese MS: no deformity or atrophy Skin: warm and dry, no rash Neuro:  Strength and sensation are intact Psych: euthymic mood, full affect    Recent Labs: 05/05/2022: BUN 8; Creatinine, Ser 0.96; Hemoglobin 16.6; Platelets 191; Potassium 4.2; Sodium 138 07/17/2022: ALT 52   Lipid Panel    Component Value Date/Time   CHOL 187 07/17/2022 0833   TRIG 105 07/17/2022 0833   HDL 35 (L) 07/17/2022 0833   CHOLHDL 5.3 (H) 07/17/2022 0833   LDLCALC 133 (H) 07/17/2022 [id number]     Other studies Reviewed: Additional studies/ records that were reviewed today with results demonstrating: LDL 57.   ASSESSMENT AND PLAN:  Coronary artery calcification: s/p RCA stent.  Okay to stop aspirin if the nuisance bleeding increases.Would continue clopidogrel 75 mg a day. Hypertension: Avoid processed foods. Morbid obesity: Cutting back on red meats. Palpitations noted in the past.  We considered Zio patch at that time but it was deferred. Hyperlipidemia: Decrease rosuvastatin to 20 mg daily.Check lipids in about 3 months   If symptoms persist or the  cholesterol is high, could consider trying Repatha. Sister is a dermatologist who was intolerant of statins, now is on Repatha.  Can also try CoQ10 to help with muscle pain.    Current medicines are reviewed at length with the patient today.  The patient concerns regarding his medicines were addressed.  The following changes have been made:  No change  Labs/ tests ordered today include:  No orders of the defined types were placed in this encounter.   Recommend 150 minutes/week of aerobic exercise Low fat, low carb, high fiber diet recommended  Disposition:   FU in  9/24   SignedLarae Grooms, MD  10/09/2022 3:22 PM    [facility] [address], [place], [place]  [address] Phone: [phone]; Fax: [phone]

## 2022-10-09 ENCOUNTER — Encounter: Payer: Self-pay | Admitting: Interventional Cardiology

## 2022-10-09 ENCOUNTER — Ambulatory Visit: Payer: 59 | Attending: Interventional Cardiology | Admitting: Interventional Cardiology

## 2022-10-09 VITALS — BP 124/80 | HR 81 | Ht 76.0 in | Wt 327.0 lb

## 2022-10-09 DIAGNOSIS — I2510 Atherosclerotic heart disease of native coronary artery without angina pectoris: Secondary | ICD-10-CM

## 2022-10-09 DIAGNOSIS — Z955 Presence of coronary angioplasty implant and graft: Secondary | ICD-10-CM | POA: Diagnosis not present

## 2022-10-09 DIAGNOSIS — Z6839 Body mass index (BMI) 39.0-39.9, adult: Secondary | ICD-10-CM

## 2022-10-09 DIAGNOSIS — R002 Palpitations: Secondary | ICD-10-CM

## 2022-10-09 DIAGNOSIS — I10 Essential (primary) hypertension: Secondary | ICD-10-CM

## 2022-10-09 MED ORDER — ROSUVASTATIN CALCIUM 20 MG PO TABS: 20.0000 mg | ORAL_TABLET | Freq: Every day | ORAL | 3 refills | Status: DC

## 2022-10-09 NOTE — Patient Instructions (Signed)
Medication Instructions:  Your physician has recommended you make the following change in your medication:  1) DECREASE rosuvastatin to 20 mg daily  *If you need a refill on your cardiac medications before your next appointment, please call your pharmacy*   Lab Work: Fasting lipids in 3 months If you have labs (blood work) drawn today and your tests are completely normal, you will receive your results only by: [place] (if you have MyChart) OR A paper copy in the mail If you have any lab test that is abnormal or we need to change your treatment, we will call you to review the results.  Follow-Up: At [facility], you and your health needs are our priority.  As part of our continuing mission to provide you with exceptional heart care, we have created designated Provider Care Teams.  These Care Teams include your primary Cardiologist (physician) and Advanced Practice Providers (APPs -  Physician Assistants and Nurse Practitioners) who all work together to provide you with the care you need, when you need it.  Your next appointment:   September 2024  Provider:   [name], MD

## 2023-01-08 ENCOUNTER — Ambulatory Visit: Payer: 59 | Attending: Interventional Cardiology

## 2023-01-08 DIAGNOSIS — I10 Essential (primary) hypertension: Secondary | ICD-10-CM

## 2023-01-08 DIAGNOSIS — I2510 Atherosclerotic heart disease of native coronary artery without angina pectoris: Secondary | ICD-10-CM

## 2023-01-08 DIAGNOSIS — R002 Palpitations: Secondary | ICD-10-CM

## 2023-01-08 DIAGNOSIS — Z955 Presence of coronary angioplasty implant and graft: Secondary | ICD-10-CM

## 2023-01-09 LAB — LIPID PANEL
Chol/HDL Ratio: 5.5 ratio — ABNORMAL HIGH (ref 0.0–5.0)
Cholesterol, Total: 181 mg/dL (ref 100–199)
HDL: 33 mg/dL — ABNORMAL LOW (ref >39)
LDL Chol Calc (NIH): 123 mg/dL — ABNORMAL HIGH (ref 0–99)
Triglycerides: 140 mg/dL (ref 0–149)
VLDL Cholesterol Cal: 25 mg/dL (ref 5–40)

## 2023-01-14 ENCOUNTER — Other Ambulatory Visit: Payer: Self-pay | Admitting: *Deleted

## 2023-01-14 DIAGNOSIS — E785 Hyperlipidemia, unspecified: Secondary | ICD-10-CM

## 2023-01-19 ENCOUNTER — Telehealth: Payer: Self-pay | Admitting: *Deleted

## 2023-01-19 ENCOUNTER — Encounter: Payer: Self-pay | Admitting: *Deleted

## 2023-01-19 NOTE — Telephone Encounter (Signed)
Opened in error

## 2023-01-28 ENCOUNTER — Encounter: Payer: Self-pay | Admitting: Interventional Cardiology

## 2023-04-15 NOTE — Progress Notes (Unsigned)
Cardiology Office Note   Date:  04/16/2023   ID:  Brett Lowe, DOB 1961-06-28, MRN [id number]  PCP:  Emilio Aspen, MD    No chief complaint on file.  CAD  Wt Readings from Last 3 Encounters:  04/16/23 (!) 309 lb (140.2 kg)  10/09/22 (!) 327 lb (148.3 kg)  07/01/22 (!) 321 lb (145.6 kg)       History of Present Illness: Brett Lowe is a 62 y.o. male   Dist RCA lesion is 90% stenosed.   A drug-eluting stent was successfully placed using a SYNERGY XD 3.50X20.   Post intervention, there is a 0% residual stenosis.   The left ventricular systolic function is normal.   LV end diastolic pressure is normal.   The left ventricular ejection fraction is 55-65% by visual estimate.   There is no aortic valve stenosis.   Continue aggressive secondary prevention along with DAPT.      Plan for same day discharge.    In 09/2022: " Noted some leg and shoulder pains after starting rosuvastatin.  Aching legs. Had PMR in the past and was on steroids.  Tapered off aft August follow-up x-ray I think this would er a year. Sx mostly in the shoulders. "  Denies : exertional Chest pain. Dizziness. Leg edema. Nitroglycerin use. Orthopnea. Palpitations. Paroxysmal nocturnal dyspnea. Shortness of breath. Syncope.    Does weights and treadmill.  Going to [place] for 2 months.    Past Medical History:  Diagnosis Date   Abnormal hemoglobin (HCC)    Abnormal thyroid function test    Acute bronchitis    Adjustment disorder    Allergic rhinitis    Arthralgia of multiple joints    Arthritis of left knee    BPH (benign prostatic hyperplasia)    Calculus of kidney    Chronic sinusitis    Constipation    Dysphagia, pharyngoesophageal phase    ED (erectile dysfunction)    Elevated blood pressure reading    Family history of lupus erythematosus    Family history of polymyalgia rheumatica    Family history of rheumatoid arthritis    Gastroenteritis    History of COVID-19    HTN  (hypertension)    Hypogonadism in male    Male erectile dysfunction, unspecified    Morbid obesity (HCC)    Morbidly obese (HCC)    Myalgia    Pharyngitis    Polyarthralgia    Radiculopathy    Sleep apnea    Testicular hypofunction    Vitamin D deficiency     Past Surgical History:  Procedure Laterality Date   CORONARY STENT INTERVENTION N/A 05/13/2022   Procedure: CORONARY STENT INTERVENTION;  Surgeon: Corky Crafts, MD;  Location: [facility];  Service: Cardiovascular;  Laterality: N/A;   LEFT HEART CATH AND CORONARY ANGIOGRAPHY N/A 05/13/2022   Procedure: LEFT HEART CATH AND CORONARY ANGIOGRAPHY;  Surgeon: Corky Crafts, MD;  Location: [facility];  Service: Cardiovascular;  Laterality: N/A;     Current Outpatient Medications  Medication Sig Dispense Refill   amLODipine-valsartan (EXFORGE) 10-320 MG tablet Take 1 tablet by mouth daily. 90 tablet 3   aspirin EC 81 MG tablet Take 81 mg by mouth daily. Swallow whole.     augmented betamethasone dipropionate (DIPROLENE-AF) 0.05 % cream Apply topically as needed for rash.     B Complex-C (B-COMPLEX WITH VITAMIN C) tablet Take 1 tablet by mouth as needed.  clopidogrel (PLAVIX) 75 MG tablet Take 1 tablet (75 mg total) by mouth daily with breakfast. 30 tablet 11   mupirocin ointment (BACTROBAN) 2 % Apply topically as needed for rash.     sildenafil (VIAGRA) 50 MG tablet Take 50 mg by mouth daily as needed for erectile dysfunction.     testosterone enanthate (DELATESTRYL) 200 MG/ML injection Inject into the muscle once a week. For IM use only     VITAMIN D, CHOLECALCIFEROL, PO Take 2,500 Units by mouth as needed.     nitroGLYCERIN (NITROSTAT) 0.4 MG SL tablet Place 1 tablet (0.4 mg total) under the tongue every 5 (five) minutes as needed for chest pain. (Patient not taking: Reported on 04/16/2023) 25 tablet 6   rosuvastatin (CRESTOR) 20 MG tablet Take 1 tablet (20 mg total) by mouth daily. 90 tablet 3   No  current facility-administered medications for this visit.    Allergies:   Neosporin [bacitracin-polymyxin b] and Penicillins    Social History:  The patient  reports that he has never smoked. He does not have any smokeless tobacco history on file.   Family History:  The patient's family history includes Heart attack in his father; Stroke in his brother.    ROS:  Please see the history of present illness.   Otherwise, review of systems are positive for intentional weight loss.   All other systems are reviewed and negative.    PHYSICAL EXAM: VS:  BP 122/60   Pulse 77   Ht 6\' 4"  (1.93 m)   Wt (!) 309 lb (140.2 kg)   SpO2 98%   BMI 37.61 kg/m  , BMI Body mass index is 37.61 kg/m. GEN: Well nourished, well developed, in no acute distress HEENT: normal Neck: no JVD, carotid bruits, or masses Cardiac: RRR; no murmurs, rubs, or gallops,no edema  Respiratory:  clear to auscultation bilaterally, normal work of breathing GI: soft, nontender, nondistended, + BS MS: no deformity or atrophy Skin: warm and dry, no rash Neuro:  Strength and sensation are intact Psych: euthymic mood, full affect   EKG:   The ekg ordered today demonstrates NSR, no ST changes   Recent Labs: 05/05/2022: BUN 8; Creatinine, Ser 0.96; Hemoglobin 16.6; Platelets 191; Potassium 4.2; Sodium 138 07/17/2022: ALT 52   Lipid Panel    Component Value Date/Time   CHOL 181 01/08/2023 0933   TRIG 140 01/08/2023 0933   HDL 33 (L) 01/08/2023 0933   CHOLHDL 5.5 (H) 01/08/2023 0933   LDLCALC 123 (H) 01/08/2023 0933     Other studies Reviewed: Additional studies/ records that were reviewed today with results demonstrating: labs reviewed; LDL 123.   ASSESSMENT AND PLAN:  CAD: Status post RCA stent.  No angina.  COntinue aggressive secondary prevention.  No bleeding issues on clopidogrel monotherapy. Hypertension: Avoid excessive salt.  Avoid processed foods.  The current medical regimen is effective;  continue  present plan and medications. Morbid obesity: Avoid foods with added sugar. Hyperlipidemia: Sister with statin intolerant and now taking Repatha.  Has been intolerant of higher dose statins.  LDL has been above target.  Plan to refer to [facility] lipid clinic for trial of more advanced lipid lowering drug such as Repatha.  Dr. Waynard Edwards was going to prescribe this medicine and may already have done so. He will check with their office. Colonoscopy planned at the end of the year (>12 months after the stent).  Okay to hold Plavix 5 days prior as long as he is feeling the same  way as he is now.   Current medicines are reviewed at length with the patient today.  The patient concerns regarding his medicines were addressed.  The following changes have been made:  No change  Labs/ tests ordered today include:   Orders Placed This Encounter  Procedures   EKG 12-Lead    Recommend 150 minutes/week of aerobic exercise Low fat, low carb, high fiber diet recommended  Disposition:   FU in 1 year with interventional MD   Signed, Lance Muss, MD  04/16/2023 4:10 PM    [facility] [address] [place], [place], [place]  [address] Phone: [phone]; Fax: [phone]

## 2023-04-16 ENCOUNTER — Encounter: Payer: Self-pay | Admitting: Interventional Cardiology

## 2023-04-16 ENCOUNTER — Ambulatory Visit: Payer: 59 | Attending: Interventional Cardiology | Admitting: Interventional Cardiology

## 2023-04-16 VITALS — BP 122/60 | HR 77 | Ht 76.0 in | Wt 309.0 lb

## 2023-04-16 DIAGNOSIS — I10 Essential (primary) hypertension: Secondary | ICD-10-CM

## 2023-04-16 DIAGNOSIS — R9431 Abnormal electrocardiogram [ECG] [EKG]: Secondary | ICD-10-CM

## 2023-04-16 DIAGNOSIS — Z955 Presence of coronary angioplasty implant and graft: Secondary | ICD-10-CM | POA: Diagnosis not present

## 2023-04-16 DIAGNOSIS — Z6839 Body mass index (BMI) 39.0-39.9, adult: Secondary | ICD-10-CM

## 2023-04-16 DIAGNOSIS — I2510 Atherosclerotic heart disease of native coronary artery without angina pectoris: Secondary | ICD-10-CM

## 2023-04-16 DIAGNOSIS — E785 Hyperlipidemia, unspecified: Secondary | ICD-10-CM

## 2023-04-16 MED ORDER — CLOPIDOGREL BISULFATE 75 MG PO TABS: 75.0000 mg | ORAL_TABLET | Freq: Every day | ORAL | 11 refills | Status: DC

## 2023-04-16 NOTE — Patient Instructions (Signed)
Medication Instructions:  Your physician recommends that you continue on your current medications as directed. Please refer to the Current Medication list given to you today.  *If you need a refill on your cardiac medications before your next appointment, please call your pharmacy*  Lab Work: None ordered today.  Testing/Procedures: None ordered today.  Follow-Up: At [facility], you and your health needs are our priority.  As part of our continuing mission to provide you with exceptional heart care, we have created designated Provider Care Teams.  These Care Teams include your primary Cardiologist (physician) and Advanced Practice Providers (APPs -  Physician Assistants and Nurse Practitioners) who all work together to provide you with the care you need, when you need it.  Your next appointment:   12 month(s)  The format for your next appointment:   In Person  Provider:   Yates Decamp, MD or Truett Mainland, MD or Peter [place], MD or Bryan Lemma, MD or Alverda Skeans, MD {

## 2023-04-27 ENCOUNTER — Encounter: Payer: Self-pay | Admitting: Pharmacist

## 2023-05-13 NOTE — Telephone Encounter (Signed)
Called patient and LVM to call back Calling to see if he was able to get Repatha from PCP or if he needs to come in for lipid apt

## 2023-07-01 ENCOUNTER — Telehealth: Payer: Self-pay | Admitting: *Deleted

## 2023-07-01 ENCOUNTER — Telehealth (HOSPITAL_BASED_OUTPATIENT_CLINIC_OR_DEPARTMENT_OTHER): Payer: Self-pay | Admitting: *Deleted

## 2023-07-01 IMAGING — MR MR KNEE*L* W/O CM
4 of 7 series · 18 of 40 positions shown · non-contrast
Comparison: None.

CLINICAL DATA: Left knee pain and swelling.

EXAM:
MRI OF THE LEFT KNEE WITHOUT CONTRAST
TECHNIQUE: Multiplanar, multisequence MR imaging of the knee was performed. No
intravenous contrast was administered.

[Series 4: T2 fat-sat · axial · 4.5mm · 0.33mm/px · z∈[-63,+60]mm · 3 of 32 slices shown]
[im 7/32]
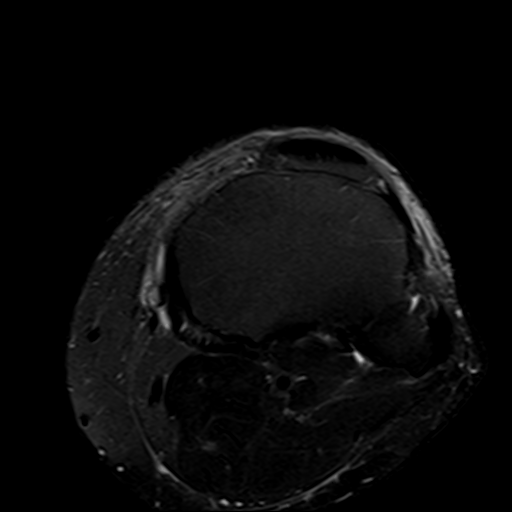
[im 19/32]
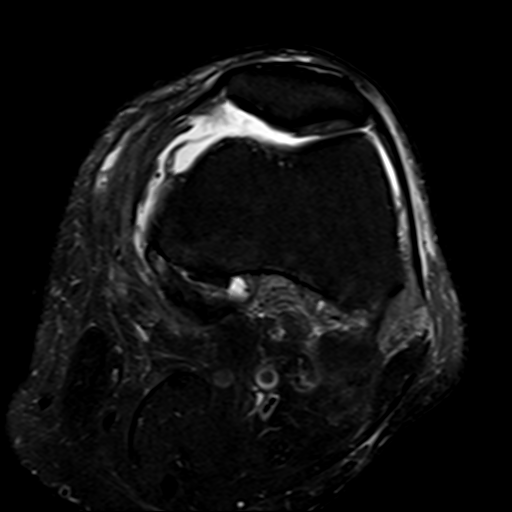
[im 32/32]
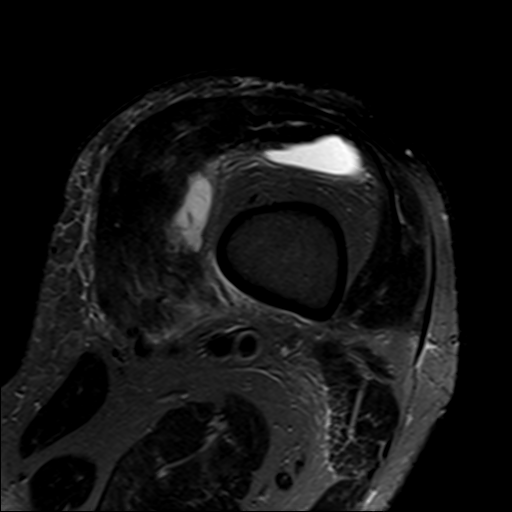

[Series 7: PD fat-sat · coronal · 3.5mm · 0.33mm/px · 7 of 39 slices shown (1 of 3)]
[im 1/39]
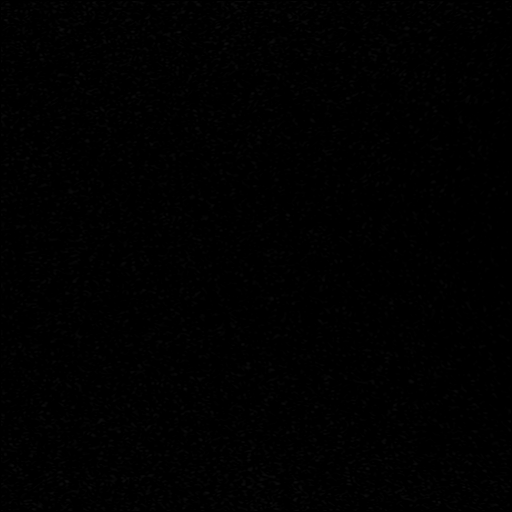
[im 7/39]
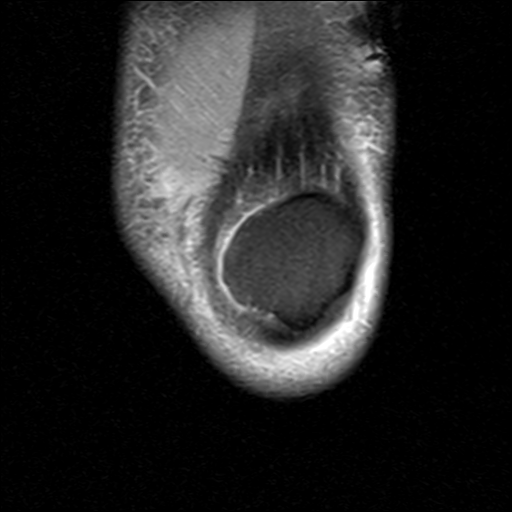
[im 13/39]
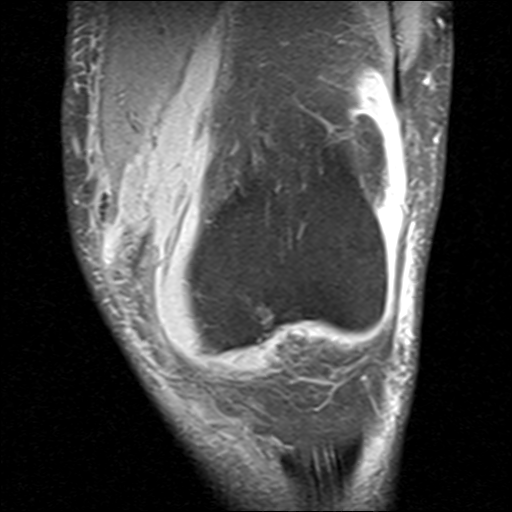
[im 20/39]
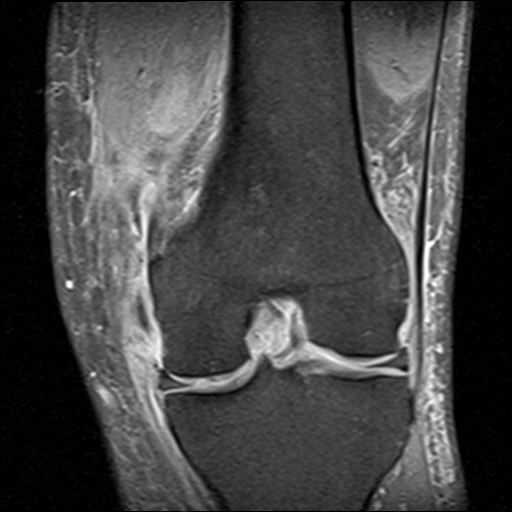
[im 26/39]
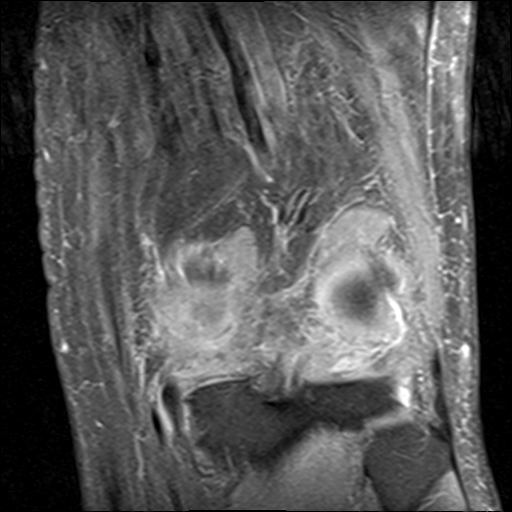
[im 32/39]
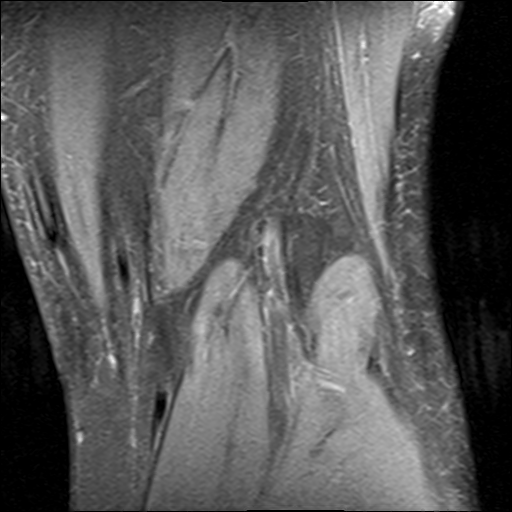
[im 39/39]
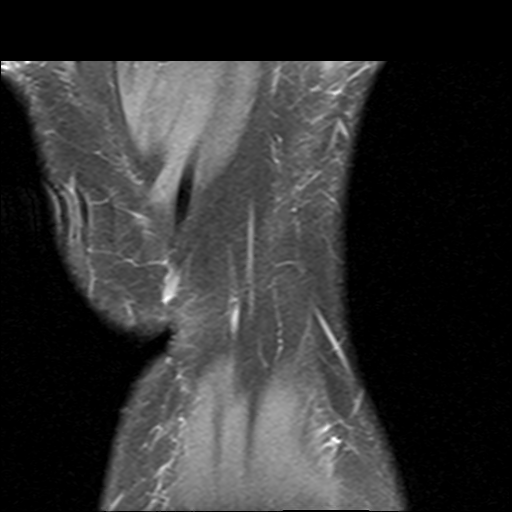

[Series 8: PD fat-sat · sagittal · 3.5mm · 0.33mm/px · 5 of 30 slices shown (2 of 3)]
[im 1/30]
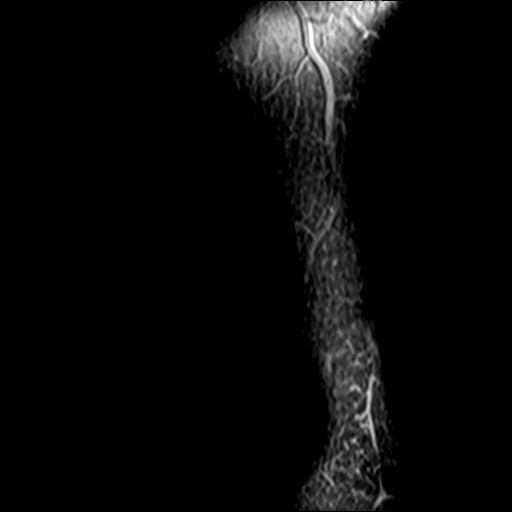
[im 6/30]
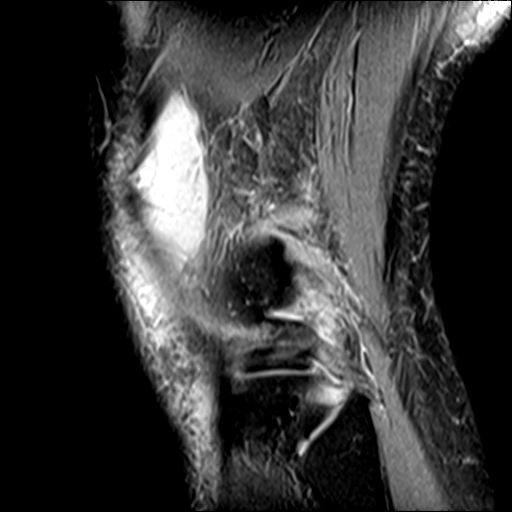
[im 12/30]
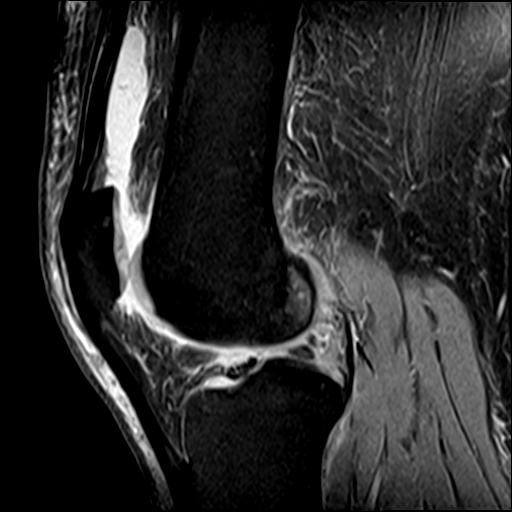
[im 18/30]
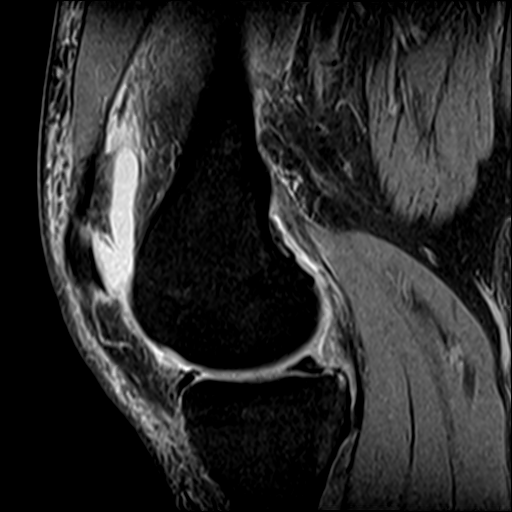
[im 30/30]
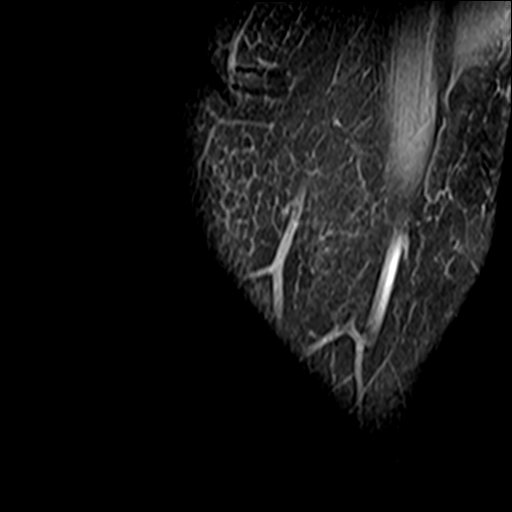

[Series 10: PD fat-sat · oblique · 2.3mm · 0.33mm/px · 3 of 18 slices shown (3 of 3)]
[im 1/18]
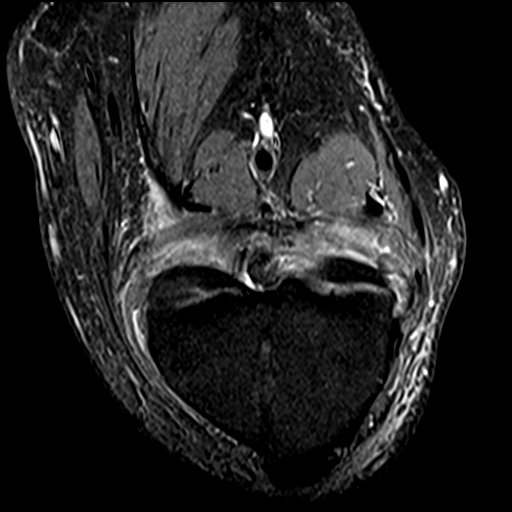
[im 9/18]
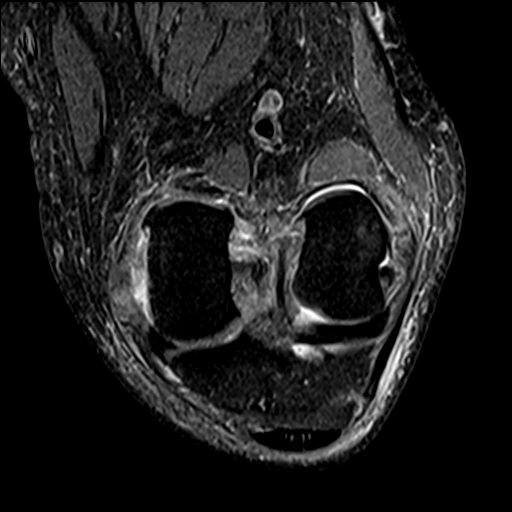
[im 18/18]
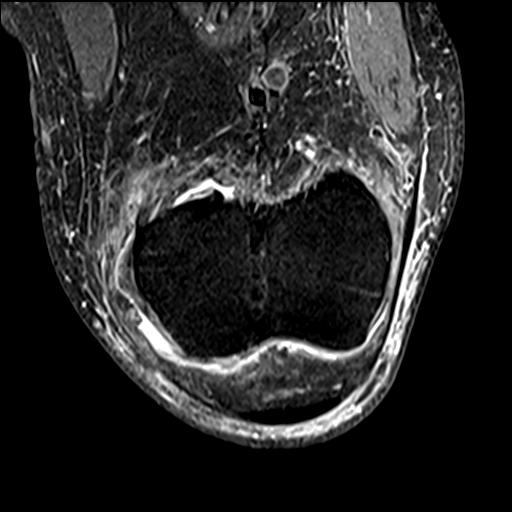

[18 of 40 positions shown; findings below may reference images not displayed]

FINDINGS: MENISCI

Medial: Complex tear of the body/posterior horn of the medial
meniscus

Lateral: Intact.

LIGAMENTS

Cruciates: Posterior cruciate ligament is intact. Heterogeneous
signal in the expected location of the ACL with laxity of the distal
ligament consistent with full-thickness tear.

Collaterals: Edema about the femoral attachment of the medial
collateral ligament concerning for grade 2 sprain/partial-thickness
tear. Lateral collateral ligament complex is intact.

CARTILAGE

Patellofemoral: Full-thickness cartilage defect about the patellar
apex with subchondral cystic changes and edema of the patellar apex.
Deep fissuring and subchondral cystic changes of the trochlea.

Medial: Deep fissuring at the weight-bearing area with mild
subchondral edema of the medial femoral condyle.

Lateral:  No chondral defect.

JOINT: Large joint effusion. Normal [place]. Moderate plical
thickening.

POPLITEAL FOSSA: Popliteus tendon is intact. No Baker's cyst.

EXTENSOR MECHANISM: Intact quadriceps tendon. Intact patellar tendon
with mild insertional tendinopathy. Intact lateral patellar
retinaculum. Intact medial patellar retinaculum. Intact MPFL.

BONES: No aggressive osseous lesion. No fracture or dislocation.
Focal marrow edema of the distal lateral femoral condyle suggesting
bone contusion.

Other: No fluid collection or hematoma. Muscles are normal.
IMPRESSION: 1. Full-thickness tear of the anterior cruciate ligament about the
femoral attachment.

2. Partial-thickness medial collateral ligament tear/grade 2 sprain.

3. Complex tear of the posterior horn of the medial meniscus.

4. Moderate patellofemoral osteoarthritis with large joint effusion
and synovitis.

5. Focal bone contusion of the lateral femoral condyle without
evidence of fracture.

## 2023-07-01 NOTE — Telephone Encounter (Signed)
Pt has been scheduled tele pre op appt add on per Joni Reining, DNP due to procedure date and med hold.     Patient Consent for Virtual Visit        Brett Lowe has provided verbal consent on 07/01/2023 for a virtual visit (video or telephone).   CONSENT FOR VIRTUAL VISIT FOR:  Brett Lowe  By participating in this virtual visit I agree to the following:  I hereby voluntarily request, consent and authorize [facility] and its employed or contracted physicians, physician assistants, nurse practitioners or other licensed health care professionals (the Practitioner), to provide me with telemedicine health care services (the "Services") as deemed necessary by the treating Practitioner. I acknowledge and consent to receive the Services by the Practitioner via telemedicine. I understand that the telemedicine visit will involve communicating with the Practitioner through live audiovisual communication technology and the disclosure of certain medical information by electronic transmission. I acknowledge that I have been given the opportunity to request an in-person assessment or other available alternative prior to the telemedicine visit and am voluntarily participating in the telemedicine visit.  I understand that I have the right to withhold or withdraw my consent to the use of telemedicine in the course of my care at any time, without affecting my right to future care or treatment, and that the Practitioner or I may terminate the telemedicine visit at any time. I understand that I have the right to inspect all information obtained and/or recorded in the course of the telemedicine visit and may receive copies of available information for a reasonable fee.  I understand that some of the potential risks of receiving the Services via telemedicine include:  Delay or interruption in medical evaluation due to technological equipment failure or disruption; Information transmitted may not be  sufficient (e.g. poor resolution of images) to allow for appropriate medical decision making by the Practitioner; and/or  In rare instances, security protocols could fail, causing a breach of personal health information.  Furthermore, I acknowledge that it is my responsibility to provide information about my medical history, conditions and care that is complete and accurate to the best of my ability. I acknowledge that Practitioner's advice, recommendations, and/or decision may be based on factors not within their control, such as incomplete or inaccurate data provided by me or distortions of diagnostic images or specimens that may result from electronic transmissions. I understand that the practice of medicine is not an exact science and that Practitioner makes no warranties or guarantees regarding treatment outcomes. I acknowledge that a copy of this consent can be made available to me via my patient portal [facility] MyChart), or I can request a printed copy by calling the office of [facility].    I understand that my insurance will be billed for this visit.   I have read or had this consent read to me. I understand the contents of this consent, which adequately explains the benefits and risks of the Services being provided via telemedicine.  I have been provided ample opportunity to ask questions regarding this consent and the Services and have had my questions answered to my satisfaction. I give my informed consent for the services to be provided through the use of telemedicine in my medical care

## 2023-07-01 NOTE — Telephone Encounter (Signed)
   Pre-operative Risk Assessment    Patient Name: Brett Lowe  DOB: [birth year] MRN: [id number]  Last OV: Dr. [name]  04/16/2023  Upcoming OV: None     Request for Surgical Clearance    Procedure:   Endoscopy  Date of Surgery:  Clearance 07/23/23                                 Surgeon:  Dr. Bernette Redbird Surgeon's Group or Practice Name:  [facility] Phone number:  [phone] Fax number:  [phone]   Type of Clearance Requested:   - Medical  - Pharmacy:  Hold Aspirin and Clopidogrel (Plavix) Not Indicated.   Type of Anesthesia:   Propofol   Additional requests/questions:    Signed, Emmit Pomfret   07/01/2023, 2:25 PM

## 2023-07-01 NOTE — Telephone Encounter (Signed)
Pt has been scheduled tele pre op appt add on per Joni Reining, DNP due to procedure date and med hold.

## 2023-07-01 NOTE — Telephone Encounter (Signed)
   Name: Brett Lowe  DOB: March 04, 1961  MRN: [id number]  Primary Cardiologist: Lance Muss, MD   Preoperative team, please contact this patient and set up a phone call appointment for further preoperative risk assessment. Please obtain consent and complete medication review. Thank you for your help.Saw [name] 04/16/2023   I confirm that guidance regarding antiplatelet and oral anticoagulation therapy has been completed and, if necessary, noted below.  Per office protocol, if patient is without any new symptoms or concerns at the time of their virtual visit, he/she may hold Plavix for 5 days and ASA for 7 days prior to procedure. Please resume ASA and Plavix as soon as possible postprocedure, at the discretion of the surgeon.    I also confirmed the patient resides in the state of [place]. As per [facility] Medical Board telemedicine laws, the patient must reside in the state in which the provider is licensed.   Joni Reining, NP 07/01/2023, 3:18 PM [facility]

## 2023-07-13 ENCOUNTER — Ambulatory Visit: Payer: 59 | Attending: Physician Assistant

## 2023-07-13 NOTE — Progress Notes (Signed)
Called x 2 for virtual appointment. Left VM.   Sharlene Dory, PA-C

## 2023-07-15 ENCOUNTER — Telehealth: Payer: Self-pay | Admitting: Interventional Cardiology

## 2023-07-15 NOTE — Telephone Encounter (Signed)
Spoke with the patient and got his telehealth appt rescheduled.   Contacted requested providers office and made them aware patient missed appt on 07/13/23. I advised a call back with any questions and stated procedure will need to be moved back.

## 2023-07-15 NOTE — Telephone Encounter (Signed)
Patient is calling to reschedule missed tele-visit for preop clearance. He reports if he cannot be done today the surgery will have to be rescheduled. Please advise.

## 2023-07-23 ENCOUNTER — Other Ambulatory Visit: Payer: Self-pay | Admitting: Internal Medicine

## 2023-07-23 DIAGNOSIS — Z122 Encounter for screening for malignant neoplasm of respiratory organs: Secondary | ICD-10-CM

## 2023-08-04 ENCOUNTER — Ambulatory Visit: Payer: 59 | Attending: Cardiology | Admitting: Nurse Practitioner

## 2023-08-04 DIAGNOSIS — Z01810 Encounter for preprocedural cardiovascular examination: Secondary | ICD-10-CM | POA: Diagnosis not present

## 2023-08-04 NOTE — Progress Notes (Signed)
Virtual Visit via Telephone Note   Because of Eligah L Lowe's co-morbid illnesses, he is at least at moderate risk for complications without adequate follow up.  This format is felt to be most appropriate for this patient at this time.  The patient did not have access to video technology/had technical difficulties with video requiring transitioning to audio format only (telephone).  All issues noted in this document were discussed and addressed.  No physical exam could be performed with this format.  Please refer to the patient's chart for his consent to telehealth for [facility].  Evaluation Performed:  Preoperative cardiovascular risk assessment _____________   Date:  08/04/2023   Patient ID:  Brett Lowe, DOB Dec 22, 1960, MRN [id number] Patient Location:  Home Provider location:   Office  Primary Care Provider:  Emilio Aspen, MD Primary Cardiologist:  Lance Muss, MD  Chief Complaint / Patient Profile   62 y.o. y/o male with a h/o CAD s/p DES-RCA in 04/2022, hypertension, hyperlipidemia, and obesity who is pending endoscopy with Dr. Bernette Redbird of [facility] and presents today for telephonic preoperative cardiovascular risk assessment.  History of Present Illness    Brett Lowe is a 62 y.o. male who presents via audio/video conferencing for a telehealth visit today.  Pt was last seen in cardiology clinic on 04/16/2023 by Dr. [name].  At that time BRAWLEY Lowe was doing well.  The patient is now pending procedure as outlined above. Since his last visit, he has done well from a cardiac standpoint.  He notes he was prescribed Repatha per his PCP and recently started taking this medication.  He denies chest pain, palpitations, dyspnea, pnd, orthopnea, n, v, dizziness, syncope, edema, weight gain, or early satiety. All other systems reviewed and are otherwise negative except as noted above.   Past Medical History    Past Medical History:  Diagnosis Date    Abnormal hemoglobin (HCC)    Abnormal thyroid function test    Acute bronchitis    Adjustment disorder    Allergic rhinitis    Arthralgia of multiple joints    Arthritis of left knee    BPH (benign prostatic hyperplasia)    Calculus of kidney    Chronic sinusitis    Constipation    Dysphagia, pharyngoesophageal phase    ED (erectile dysfunction)    Elevated blood pressure reading    Family history of lupus erythematosus    Family history of polymyalgia rheumatica    Family history of rheumatoid arthritis    Gastroenteritis    History of COVID-19    HTN (hypertension)    Hypogonadism in male    Male erectile dysfunction, unspecified    Morbid obesity (HCC)    Morbidly obese (HCC)    Myalgia    Pharyngitis    Polyarthralgia    Radiculopathy    Sleep apnea    Testicular hypofunction    Vitamin D deficiency    Past Surgical History:  Procedure Laterality Date   CORONARY STENT INTERVENTION N/A 05/13/2022   Procedure: CORONARY STENT INTERVENTION;  Surgeon: Corky Crafts, MD;  Location: [facility] INVASIVE CV LAB;  Service: Cardiovascular;  Laterality: N/A;   LEFT HEART CATH AND CORONARY ANGIOGRAPHY N/A 05/13/2022   Procedure: LEFT HEART CATH AND CORONARY ANGIOGRAPHY;  Surgeon: Corky Crafts, MD;  Location: [facility] INVASIVE CV LAB;  Service: Cardiovascular;  Laterality: N/A;    Allergies  Allergies  Allergen Reactions   Neosporin [Bacitracin-Polymyxin B] Rash and  Other (See Comments)    Blisters (at area applied only)   Penicillins Rash    Home Medications    Prior to Admission medications   Medication Sig Start Date End Date Taking? Authorizing Provider  amLODipine-valsartan (EXFORGE) 10-320 MG tablet Take 1 tablet by mouth daily. 05/05/22   [name]., NP  aspirin EC 81 MG tablet Take 81 mg by mouth daily. Swallow whole.    [provider]  augmented betamethasone dipropionate (DIPROLENE-AF) 0.05 % cream Apply topically as needed for rash. 04/26/22    [provider]  B Complex-C (B-COMPLEX WITH VITAMIN C) tablet Take 1 tablet by mouth as needed.    [provider]  clopidogrel (PLAVIX) 75 MG tablet Take 1 tablet (75 mg total) by mouth daily with breakfast. 04/16/23   Corky Crafts, MD  mupirocin ointment (BACTROBAN) 2 % Apply topically as needed for rash. 04/26/22   [provider]  nitroGLYCERIN (NITROSTAT) 0.4 MG SL tablet Place 1 tablet (0.4 mg total) under the tongue every 5 (five) minutes as needed for chest pain. Patient not taking: Reported on 04/16/2023 05/01/22   Corky Crafts, MD  rosuvastatin (CRESTOR) 20 MG tablet Take 1 tablet (20 mg total) by mouth daily. 10/09/22   Corky Crafts, MD  sildenafil (VIAGRA) 50 MG tablet Take 50 mg by mouth daily as needed for erectile dysfunction.    [provider]  testosterone enanthate (DELATESTRYL) 200 MG/ML injection Inject into the muscle once a week. For IM use only    [provider]  VITAMIN D, CHOLECALCIFEROL, PO Take 2,500 Units by mouth as needed.    [provider]    Physical Exam    Vital Signs:  [name] does not have vital signs available for review today.  Given telephonic nature of communication, physical exam is limited. AAOx3. NAD. Normal affect.  Speech and respirations are unlabored.  Accessory Clinical Findings    None  Assessment & Plan    1.  Preoperative Cardiovascular Risk Assessment:  According to the Revised Cardiac Risk Index (RCRI), his Perioperative Risk of Major Cardiac Event is (%): 0.9. His Functional Capacity in METs is: 9.89 according to the Duke Activity Status Index (DASI). Therefore, based on ACC/AHA guidelines, patient would be at acceptable risk for the planned procedure without further cardiovascular testing.   The patient was advised that if he develops new symptoms prior to surgery to contact our office to arrange for a follow-up visit, and he verbalized  understanding.   Per office protocol, he may hold Plavix for 5 days prior to procedure. Please resume Plavix as soon as possible postprocedure, at the discretion of the surgeon.    Regarding ASA therapy, we recommend continuation of ASA throughout the perioperative period.  However, if the surgeon feels that cessation of ASA is required in the perioperative period, it may be stopped 5-7 days prior to surgery with a plan to resume it as soon as felt to be feasible from a surgical standpoint in the post-operative period.  A copy of this note will be routed to requesting surgeon.  Time:   Today, I have spent 5 minutes with the patient with telehealth technology discussing medical history, symptoms, and management plan.     Joylene Grapes, NP  08/04/2023, 9:52 AM

## 2023-09-22 ENCOUNTER — Ambulatory Visit
Admission: RE | Admit: 2023-09-22 | Discharge: 2023-09-22 | Disposition: A | Discharge status: Home / Self Care | Payer: Commercial Managed Care - HMO | Source: Ambulatory Visit | Attending: Internal Medicine | Admitting: Internal Medicine

## 2023-09-22 DIAGNOSIS — Z122 Encounter for screening for malignant neoplasm of respiratory organs: Secondary | ICD-10-CM

## 2023-09-23 ENCOUNTER — Ambulatory Visit: Payer: Commercial Managed Care - HMO | Attending: Internal Medicine

## 2023-09-23 ENCOUNTER — Telehealth: Payer: Self-pay | Admitting: Pharmacist

## 2023-09-23 DIAGNOSIS — E785 Hyperlipidemia, unspecified: Secondary | ICD-10-CM

## 2023-09-23 NOTE — Telephone Encounter (Signed)
 Patient was referred to lipid clinic to initiate PCSK9i. However internal medicine had already started Repatha. Patient reports he took his 5th injection last week so will get updated lab tomorrow and if LDLc still remains above the goal, will re-schedule appointment with lipid clinic.   Current Medications: Repatha 140 mg SQ Q14D Intolerances: Crestor  20 mg daily  Risk Factors:  hypertension, obesity, OSA, CAD s/p RCA stent  LDL goal: <70  07/23/23 lab : LDLc 126, HDL 49, TG 195, TC 209 - after receiving 1 injection of Repatha  01/08/23 lab: TC 181, TG 140, HDL 33, LDL 123

## 2023-09-23 NOTE — Progress Notes (Unsigned)
 Patient ID: Brett Lowe                 DOB: Jun 21, 1961                    MRN: [id number]      HPI: Brett Lowe is a 63 y.o. male patient referred to lipid clinic by Dr.Hochrein. PMH is significant for hypertension, obesity, OSA, CAD s/p RCA stent .    Reviewed options for lowering LDL cholesterol, including ezetimibe, PCSK-9 inhibitors, bempedoic acid and inclisiran.  Discussed mechanisms of action, dosing, side effects and potential decreases in LDL cholesterol.  Also reviewed cost information and potential options for patient assistance.  Current Medications: Crestor  20 mg daily  Intolerances:  Risk Factors:  hypertension, obesity, OSA, CAD s/p RCA stent  LDL goal: <70  07/23/2023: LDLc 126, HDL 49, TG 195, TC 209  Mid nov  Diet:   Exercise:   Family History:   Social History:   Labs: Lipid Panel     Component Value Date/Time   CHOL 181 01/08/2023 0933   TRIG 140 01/08/2023 0933   HDL 33 (L) 01/08/2023 0933   CHOLHDL 5.5 (H) 01/08/2023 0933   LDLCALC 123 (H) 01/08/2023 0933   LABVLDL 25 01/08/2023 0933    Past Medical History:  Diagnosis Date   Abnormal hemoglobin (HCC)    Abnormal thyroid function test    Acute bronchitis    Adjustment disorder    Allergic rhinitis    Arthralgia of multiple joints    Arthritis of left knee    BPH (benign prostatic hyperplasia)    Calculus of kidney    Chronic sinusitis    Constipation    Dysphagia, pharyngoesophageal phase    ED (erectile dysfunction)    Elevated blood pressure reading    Family history of lupus erythematosus    Family history of polymyalgia rheumatica    Family history of rheumatoid arthritis    Gastroenteritis    History of COVID-19    HTN (hypertension)    Hypogonadism in male    Male erectile dysfunction, unspecified    Morbid obesity (HCC)    Morbidly obese (HCC)    Myalgia    Pharyngitis    Polyarthralgia    Radiculopathy    Sleep apnea    Testicular hypofunction    Vitamin D deficiency      Current Outpatient Medications on File Prior to Visit  Medication Sig Dispense Refill   amLODipine -valsartan  (EXFORGE ) 10-320 MG tablet Take 1 tablet by mouth daily. 90 tablet 3   aspirin  EC 81 MG tablet Take 81 mg by mouth daily. Swallow whole.     augmented betamethasone dipropionate (DIPROLENE-AF) 0.05 % cream Apply topically as needed for rash.     B Complex-C (B-COMPLEX WITH VITAMIN C) tablet Take 1 tablet by mouth as needed.     clopidogrel  (PLAVIX ) 75 MG tablet Take 1 tablet (75 mg total) by mouth daily with breakfast. 30 tablet 11   mupirocin ointment (BACTROBAN) 2 % Apply topically as needed for rash.     nitroGLYCERIN  (NITROSTAT ) 0.4 MG SL tablet Place 1 tablet (0.4 mg total) under the tongue every 5 (five) minutes as needed for chest pain. (Patient not taking: Reported on 04/16/2023) 25 tablet 6   rosuvastatin  (CRESTOR ) 20 MG tablet Take 1 tablet (20 mg total) by mouth daily. 90 tablet 3   sildenafil (VIAGRA) 50 MG tablet Take 50 mg by mouth daily as needed for erectile  dysfunction.     testosterone enanthate (DELATESTRYL) 200 MG/ML injection Inject into the muscle once a week. For IM use only     VITAMIN D, CHOLECALCIFEROL, PO Take 2,500 Units by mouth as needed.     No current facility-administered medications on file prior to visit.    Allergies  Allergen Reactions   Neosporin [Bacitracin-Polymyxin B] Rash and Other (See Comments)    Blisters (at area applied only)   Penicillins Rash    Assessment/Plan:  1. Hyperlipidemia -  No problems updated. No problem-specific Assessment & Plan notes found for this encounter.    Thank you,  Robbi Blanch, Pharm.D  [facility] [address], [place], [place] [address]  Phone: [phone]; Fax: [phone]

## 2023-09-24 ENCOUNTER — Telehealth: Payer: Self-pay | Admitting: *Deleted

## 2023-09-24 NOTE — Telephone Encounter (Signed)
   Pre-operative Risk Assessment    Patient Name: Brett Lowe  DOB: 02-24-1961 MRN: [id number]   Date of last office visit: 08/04/23 TELE VISIT PREOP; 04/16/23 DR. VARANASI Date of next office visit: NONE   Request for Surgical Clearance    Procedure:   SMAS (FACELIFT), LIPO TO THE NECK  Date of Surgery:  Clearance 10/15/23                                Surgeon:  DR. ELIA DEEM Surgeon's Group or Practice Name:  [facility] PLASTIC [facility] Phone number:  [phone] Fax number:  [phone]   Type of Clearance Requested:   - Medical  - Pharmacy:  Hold Aspirin  and Clopidogrel  (Plavix )     Type of Anesthesia:   PROPOFOL   Additional requests/questions:  Please fax a copy of EKG 04/16/23 to the surgeon's office.  Bonney Niels Jest   09/24/2023, 3:37 PM

## 2023-09-25 ENCOUNTER — Telehealth: Payer: Self-pay | Admitting: *Deleted

## 2023-09-25 LAB — LIPID PANEL
Chol/HDL Ratio: 2.2 {ratio} (ref 0.0–5.0)
Cholesterol, Total: 111 mg/dL (ref 100–199)
HDL: 51 mg/dL (ref >39)
LDL Chol Calc (NIH): 39 mg/dL (ref 0–99)
Triglycerides: 117 mg/dL (ref 0–149)
VLDL Cholesterol Cal: 21 mg/dL (ref 5–40)

## 2023-09-25 LAB — LIPOPROTEIN A (LPA): Lipoprotein (a): 99.4 nmol/L — ABNORMAL HIGH (ref <75.0)

## 2023-09-25 NOTE — Telephone Encounter (Signed)
 Pt has been scheduled tele preop appt 10/08/23. Med rec and consent are done. Surgeon office is requesting most recent EKG to be faxed over as well.

## 2023-09-25 NOTE — Telephone Encounter (Signed)
 Pt has been scheduled tele preop appt 10/08/23. Med rec and consent are done. Surgeon office is requesting most recent EKG to be faxed over as well.      Patient Consent for Virtual Visit        Talor L Hagedorn has provided verbal consent on 09/25/2023 for a virtual visit (video or telephone).   CONSENT FOR VIRTUAL VISIT FOR:  Norleen CROME Tutson  By participating in this virtual visit I agree to the following:  I hereby voluntarily request, consent and authorize [facility] and its employed or contracted physicians, physician assistants, nurse practitioners or other licensed health care professionals (the Practitioner), to provide me with telemedicine health care services (the "Services) as deemed necessary by the treating Practitioner. I acknowledge and consent to receive the Services by the Practitioner via telemedicine. I understand that the telemedicine visit will involve communicating with the Practitioner through live audiovisual communication technology and the disclosure of certain medical information by electronic transmission. I acknowledge that I have been given the opportunity to request an in-person assessment or other available alternative prior to the telemedicine visit and am voluntarily participating in the telemedicine visit.  I understand that I have the right to withhold or withdraw my consent to the use of telemedicine in the course of my care at any time, without affecting my right to future care or treatment, and that the Practitioner or I may terminate the telemedicine visit at any time. I understand that I have the right to inspect all information obtained and/or recorded in the course of the telemedicine visit and may receive copies of available information for a reasonable fee.  I understand that some of the potential risks of receiving the Services via telemedicine include:  Delay or interruption in medical evaluation due to technological equipment failure or  disruption; Information transmitted may not be sufficient (e.g. poor resolution of images) to allow for appropriate medical decision making by the Practitioner; and/or  In rare instances, security protocols could fail, causing a breach of personal health information.  Furthermore, I acknowledge that it is my responsibility to provide information about my medical history, conditions and care that is complete and accurate to the best of my ability. I acknowledge that Practitioner's advice, recommendations, and/or decision may be based on factors not within their control, such as incomplete or inaccurate data provided by me or distortions of diagnostic images or specimens that may result from electronic transmissions. I understand that the practice of medicine is not an exact science and that Practitioner makes no warranties or guarantees regarding treatment outcomes. I acknowledge that a copy of this consent can be made available to me via my patient portal [facility] MyChart), or I can request a printed copy by calling the office of [facility].    I understand that my insurance will be billed for this visit.   I have read or had this consent read to me. I understand the contents of this consent, which adequately explains the benefits and risks of the Services being provided via telemedicine.  I have been provided ample opportunity to ask questions regarding this consent and the Services and have had my questions answered to my satisfaction. I give my informed consent for the services to be provided through the use of telemedicine in my medical care

## 2023-09-25 NOTE — Telephone Encounter (Signed)
 Left message to call back to schedule tele pre op appt.

## 2023-09-25 NOTE — Telephone Encounter (Signed)
   Name: DANIIL LABARGE  DOB: May 31, 1961  MRN: [id number]  Primary Cardiologist: Candyce Reek, MD   Preoperative team, please contact this patient and set up a phone call appointment for further preoperative risk assessment. Please obtain consent and complete medication review. Thank you for your help.  I confirm that guidance regarding antiplatelet and oral anticoagulation therapy has been completed and, if necessary, noted below.  Per office protocol, he may hold Plavix  for 5 days prior to procedure. Please resume Plavix  as soon as possible postprocedure, at the discretion of the surgeon.     Regarding ASA therapy, we recommend continuation of ASA throughout the perioperative period.  However, if the surgeon feels that cessation of ASA is required in the perioperative period, it may be stopped 5-7 days prior to surgery with a plan to resume it as soon as felt to be feasible from a surgical standpoint in the post-operative period.   I also confirmed the patient resides in the state of [place] . As per [facility] Medical Board telemedicine laws, the patient must reside in the state in which the provider is licensed.   Josefa CHRISTELLA Beauvais, NP 09/25/2023, 1:12 PM [facility]

## 2023-10-02 ENCOUNTER — Other Ambulatory Visit (HOSPITAL_BASED_OUTPATIENT_CLINIC_OR_DEPARTMENT_OTHER): Payer: Self-pay

## 2023-10-02 MED ORDER — MOUNJARO 2.5 MG/0.5ML ~~LOC~~ SOAJ
2.5000 mg | SUBCUTANEOUS | 1 refills | Status: AC
Start: 2023-10-02 — End: ?
  Filled 2023-10-02: qty 2, 28d supply, fill #0
  Filled 2023-10-25: qty 2, 28d supply, fill #1

## 2023-10-05 NOTE — Progress Notes (Deleted)
 Virtual Visit via Telephone Note   Because of Brett Lowe co-morbid illnesses, he is at least at moderate risk for complications without adequate follow up.  This format is felt to be most appropriate for this patient at this time.  Due to technical limitations with video connection (technology), today's appointment will be conducted as an audio only telehealth visit, and [name] verbally agreed to proceed in this manner.   All issues noted in this document were discussed and addressed.  No physical exam could be performed with this format.  Evaluation Performed:  Preoperative cardiovascular risk assessment _____________   Date:  10/05/2023   Patient ID:  Brett Lowe, DOB May 07, 1961, MRN [id number] Patient Location:  Home Provider location:   Office  Primary Care Provider:  Emilio Aspen, MD Primary Cardiologist:  Lance Muss, MD  Chief Complaint / Patient Profile   63 y.o. y/o male with a h/o coronary artery disease status post drug-eluting stent to the distal RCA on 05/13/2022, hyperlipidemia, and hypertension.  He is pending SMAS (facelift) lipo to the neck by Dr. Allayne Stack, on 10/15/2023; and presents today for telephonic preoperative cardiovascular risk assessment with recommendations concerning holding of aspirin and clopidogrel preoperatively.  History of Present Illness    Brett Lowe is a 63 y.o. male who presents via audio/video conferencing for a telehealth visit today.  Pt was last seen in cardiology clinic on 04/16/2019 for by Dr.Varanasi..  At that time Brett Lowe was doing well and was continued on aggressive secondary prevention, blood pressure was well-controlled, he was referred to [facility].D. and lipid clinic for PCSK9 inhibition as he was intolerant of statins.  He was noted that he had a planned colonoscopy at the end of the year and was okay to hold Plavix for 5 days at that time..  The patient is now pending procedure as outlined above. Since  his last visit, he ***  Past Medical History    Past Medical History:  Diagnosis Date   Abnormal hemoglobin (HCC)    Abnormal thyroid function test    Acute bronchitis    Adjustment disorder    Allergic rhinitis    Arthralgia of multiple joints    Arthritis of left knee    BPH (benign prostatic hyperplasia)    Calculus of kidney    Chronic sinusitis    Constipation    Dysphagia, pharyngoesophageal phase    ED (erectile dysfunction)    Elevated blood pressure reading    Family history of lupus erythematosus    Family history of polymyalgia rheumatica    Family history of rheumatoid arthritis    Gastroenteritis    History of COVID-19    HTN (hypertension)    Hypogonadism in male    Male erectile dysfunction, unspecified    Morbid obesity (HCC)    Morbidly obese (HCC)    Myalgia    Pharyngitis    Polyarthralgia    Radiculopathy    Sleep apnea    Testicular hypofunction    Vitamin D deficiency    Past Surgical History:  Procedure Laterality Date   CORONARY STENT INTERVENTION N/A 05/13/2022   Procedure: CORONARY STENT INTERVENTION;  Surgeon: Corky Crafts, MD;  Location: [facility] INVASIVE CV LAB;  Service: Cardiovascular;  Laterality: N/A;   LEFT HEART CATH AND CORONARY ANGIOGRAPHY N/A 05/13/2022   Procedure: LEFT HEART CATH AND CORONARY ANGIOGRAPHY;  Surgeon: Corky Crafts, MD;  Location: [facility] INVASIVE CV LAB;  Service: Cardiovascular;  Laterality: N/A;    Allergies  Allergies  Allergen Reactions   Neosporin [Bacitracin-Polymyxin B] Rash and Other (See Comments)    Blisters (at area applied only)   Penicillins Rash    Home Medications    Prior to Admission medications   Medication Sig Start Date End Date Taking? Authorizing Provider  amLODipine-valsartan (EXFORGE) 10-320 MG tablet Take 1 tablet by mouth daily. 05/05/22   [name]., NP  aspirin EC 81 MG tablet Take 81 mg by mouth daily. Swallow whole.    [provider]  augmented  betamethasone dipropionate (DIPROLENE-AF) 0.05 % cream Apply topically as needed for rash. 04/26/22   [provider]  B Complex-C (B-COMPLEX WITH VITAMIN C) tablet Take 1 tablet by mouth as needed.    [provider]  clopidogrel (PLAVIX) 75 MG tablet Take 1 tablet (75 mg total) by mouth daily with breakfast. 04/16/23   Corky Crafts, MD  mupirocin ointment (BACTROBAN) 2 % Apply topically as needed for rash. 04/26/22   [provider]  nitroGLYCERIN (NITROSTAT) 0.4 MG SL tablet Place 1 tablet (0.4 mg total) under the tongue every 5 (five) minutes as needed for chest pain. 05/01/22   Corky Crafts, MD  rosuvastatin (CRESTOR) 20 MG tablet Take 1 tablet (20 mg total) by mouth daily. 10/09/22   Corky Crafts, MD  sildenafil (VIAGRA) 50 MG tablet Take 50 mg by mouth daily as needed for erectile dysfunction.    [provider]  testosterone enanthate (DELATESTRYL) 200 MG/ML injection Inject into the muscle once a week. For IM use only    [provider]  tirzepatide Greggory Keen) 2.5 MG/0.5ML Pen Inject 2.5 mg into the skin once a week. 10/02/23     VITAMIN D, CHOLECALCIFEROL, PO Take 2,500 Units by mouth as needed.    [provider]    Physical Exam    Vital Signs:  [name] does not have vital signs available for review today.***  Given telephonic nature of communication, physical exam is limited. AAOx3. NAD. Normal affect.  Speech and respirations are unlabored.  Accessory Clinical Findings    None  Assessment & Plan    1.  Preoperative Cardiovascular Risk Assessment: According to the Revised Cardiac Risk Index (RCRI), his    His   according to the [place] Activity Status Index (DASI).    Per office protocol, if patient is without any new symptoms or concerns at the time of their virtual visit, he/she may hold ASA for 7 days, and Plavix  for 6 days prior to procedure. Please resume ASA and Plavix as soon as possible  postprocedure, at the discretion of the surgeon.    The patient was advised that if he develops new symptoms prior to surgery to contact our office to arrange for a follow-up visit, and he verbalized understanding.  Therefore, based on ACC/AHA guidelines, patient would be at acceptable risk for the planned procedure without further cardiovascular testing. I will route this recommendation to the requesting party via Epic fax function.    A copy of this note will be routed to requesting surgeon.  Time:   Today, I have spent *** minutes with the patient with telehealth technology discussing medical history, symptoms, and management plan.     Joni Reining, NP  10/05/2023, 1:17 PM

## 2023-10-06 ENCOUNTER — Telehealth: Payer: Self-pay | Admitting: *Deleted

## 2023-10-06 NOTE — Telephone Encounter (Signed)
 Left message for the pt to call back to move tele appt due to inclement weather and the office opening on a 2 hour delay Thursday 10/08/23.   Pt's procedure is 10/15/23. Pt should be moved to a provider slot 10/08/23 due to procedure date.

## 2023-10-07 NOTE — Telephone Encounter (Signed)
 Spoke with patient. Explained office is operating on a 2-hour delay tomorrow. Pt accepted a telephone visit on 10/08/23 at 10:55am with A. Duke, PA.

## 2023-10-08 ENCOUNTER — Encounter: Payer: Self-pay | Admitting: Physician Assistant

## 2023-10-08 ENCOUNTER — Ambulatory Visit: Payer: Commercial Managed Care - HMO

## 2023-10-08 ENCOUNTER — Ambulatory Visit: Payer: Commercial Managed Care - HMO | Attending: Internal Medicine | Admitting: Physician Assistant

## 2023-10-08 DIAGNOSIS — Z01810 Encounter for preprocedural cardiovascular examination: Secondary | ICD-10-CM | POA: Diagnosis not present

## 2023-10-08 NOTE — Progress Notes (Signed)
 Per Micah Flesher, PAC to call the surgeon office and confirmed notes and EKG received for preop clcearance.   I have called the surgeon office [phone] [facility] and they have confirmed notes and EKG sent by Micah Flesher, PAC have been received.

## 2023-10-08 NOTE — Progress Notes (Signed)
 Virtual Visit via Telephone Note   Because of Megan Hayduk Ferrell co-morbid illnesses, he is at least at moderate risk for complications without adequate follow up.  This format is felt to be most appropriate for this patient at this time.  Due to technical limitations with video connection (technology), today's appointment will be conducted as an audio only telehealth visit, and Brett Lowe verbally agreed to proceed in this manner.   All issues noted in this document were discussed and addressed.  No physical exam could be performed with this format.  Evaluation Performed:  Preoperative cardiovascular risk assessment _____________   Date:  10/08/2023   Patient ID:  Brett Lowe, DOB 1961/04/24, MRN [id number] Patient Location:  Home Provider location:   Office  Primary Care Provider:  Emilio Aspen, MD Primary Cardiologist:  Lance Muss, MD  Chief Complaint / Patient Profile   63 y.o. y/o male with a h/o coronary artery disease status post drug-eluting stent to the distal RCA on 05/13/2022, hyperlipidemia, and hypertension.  He is pending SMAS (facelift) lipo to the neck by Dr. Allayne Stack, on 10/15/2023; and presents today for telephonic preoperative cardiovascular risk assessment with recommendations concerning holding of aspirin and clopidogrel preoperatively.  History of Present Illness    Brett Lowe is a 63 y.o. male who presents via audio/video conferencing for a telehealth visit today.  Pt was last seen in cardiology clinic on 04/16/2019 for by Dr.Varanasi.  At that time Brett Lowe was doing well and was continued on aggressive secondary prevention, blood pressure was well-controlled, he was referred to [facility].D. and lipid clinic for PCSK9 inhibition as he was intolerant of statins.  He was noted that he had a planned colonoscopy at the end of the year and was okay to hold Plavix for 5 days at that time.  The patient is now pending procedure as outlined above. Since  his last visit, he has been doing well. He continues to exercise regularly without anginal symptoms.   Past Medical History    Past Medical History:  Diagnosis Date   Abnormal hemoglobin (HCC)    Abnormal thyroid function test    Acute bronchitis    Adjustment disorder    Allergic rhinitis    Arthralgia of multiple joints    Arthritis of left knee    BPH (benign prostatic hyperplasia)    Calculus of kidney    Chronic sinusitis    Constipation    Dysphagia, pharyngoesophageal phase    ED (erectile dysfunction)    Elevated blood pressure reading    Family history of lupus erythematosus    Family history of polymyalgia rheumatica    Family history of rheumatoid arthritis    Gastroenteritis    History of COVID-19    HTN (hypertension)    Hypogonadism in male    Male erectile dysfunction, unspecified    Morbid obesity (HCC)    Morbidly obese (HCC)    Myalgia    Pharyngitis    Polyarthralgia    Radiculopathy    Sleep apnea    Testicular hypofunction    Vitamin D deficiency    Past Surgical History:  Procedure Laterality Date   CORONARY STENT INTERVENTION N/A 05/13/2022   Procedure: CORONARY STENT INTERVENTION;  Surgeon: Corky Crafts, MD;  Location: [facility] INVASIVE CV LAB;  Service: Cardiovascular;  Laterality: N/A;   LEFT HEART CATH AND CORONARY ANGIOGRAPHY N/A 05/13/2022   Procedure: LEFT HEART CATH AND CORONARY ANGIOGRAPHY;  Surgeon: Lance Muss  S, MD;  Location: [facility] INVASIVE CV LAB;  Service: Cardiovascular;  Laterality: N/A;    Allergies  Allergies  Allergen Reactions   Neosporin [Bacitracin-Polymyxin B] Rash and Other (See Comments)    Blisters (at area applied only)   Penicillins Rash    Home Medications    Prior to Admission medications   Medication Sig Start Date End Date Taking? Authorizing Provider  amLODipine-valsartan (EXFORGE) 10-320 MG tablet Take 1 tablet by mouth daily. 05/05/22   [name]., NP  aspirin EC 81 MG tablet Take 81 mg  by mouth daily. Swallow whole.    [provider]  augmented betamethasone dipropionate (DIPROLENE-AF) 0.05 % cream Apply topically as needed for rash. 04/26/22   [provider]  B Complex-C (B-COMPLEX WITH VITAMIN C) tablet Take 1 tablet by mouth as needed.    [provider]  clopidogrel (PLAVIX) 75 MG tablet Take 1 tablet (75 mg total) by mouth daily with breakfast. 04/16/23   Corky Crafts, MD  mupirocin ointment (BACTROBAN) 2 % Apply topically as needed for rash. 04/26/22   [provider]  nitroGLYCERIN (NITROSTAT) 0.4 MG SL tablet Place 1 tablet (0.4 mg total) under the tongue every 5 (five) minutes as needed for chest pain. 05/01/22   Corky Crafts, MD  rosuvastatin (CRESTOR) 20 MG tablet Take 1 tablet (20 mg total) by mouth daily. 10/09/22   Corky Crafts, MD  sildenafil (VIAGRA) 50 MG tablet Take 50 mg by mouth daily as needed for erectile dysfunction.    [provider]  testosterone enanthate (DELATESTRYL) 200 MG/ML injection Inject into the muscle once a week. For IM use only    [provider]  tirzepatide Greggory Keen) 2.5 MG/0.5ML Pen Inject 2.5 mg into the skin once a week. 10/02/23     VITAMIN D, CHOLECALCIFEROL, PO Take 2,500 Units by mouth as needed.    [provider]    Physical Exam    Vital Signs:  Brett Lowe does not have vital signs available for review today.  Given telephonic nature of communication, physical exam is limited. AAOx3. NAD. Normal affect.  Speech and respirations are unlabored.  Accessory Clinical Findings    None  Assessment & Plan    1.  Preoperative Cardiovascular Risk Assessment: According to the Revised Cardiac Risk Index (RCRI), his Perioperative Risk of Major Cardiac Event is (%): 0.9  His Functional Capacity in METs is: 6.61 according to the [place] Activity Status Index (DASI).    Per office protocol, he may hold plavix 5-7 days.  Given his 20 mm stenting in the  RCA, we preferred to continue aspirin throughout the perioperative.Marland Kitchen  However if surgeon feels continuation of ASA significantly increase his mobility abnormality, he may hold aspirin 5-7 days prior to procedure. Please resume ASA and Plavix as soon as possible postprocedure, at the discretion of the surgeon.    The patient was advised that if he develops new symptoms prior to surgery to contact our office to arrange for a follow-up visit, and he verbalized understanding.  Therefore, based on ACC/AHA guidelines, patient would be at acceptable risk for the planned procedure without further cardiovascular testing. I will route this recommendation to the requesting party via Epic fax function.    A copy of this note will be routed to requesting surgeon.  Time:   Today, I have spent 9 minutes with the patient with telehealth technology discussing medical history, symptoms, and management plan.  Marcelino Duster, PA  10/08/2023, 10:27 AM Signed, Roe Rutherford Torres [place], PA  10/08/2023 10:27 AM    [facility]

## 2023-10-26 ENCOUNTER — Other Ambulatory Visit: Payer: Self-pay

## 2023-10-26 ENCOUNTER — Other Ambulatory Visit (HOSPITAL_BASED_OUTPATIENT_CLINIC_OR_DEPARTMENT_OTHER): Payer: Self-pay

## 2023-12-01 ENCOUNTER — Other Ambulatory Visit (HOSPITAL_BASED_OUTPATIENT_CLINIC_OR_DEPARTMENT_OTHER): Payer: Self-pay

## 2023-12-01 MED ORDER — MOUNJARO 5 MG/0.5ML ~~LOC~~ SOAJ
5.0000 mg | SUBCUTANEOUS | 5 refills | Status: AC
Start: 2023-12-01 — End: ?
  Filled 2023-12-01: qty 2, 28d supply, fill #0
  Filled 2024-01-14: qty 2, 28d supply, fill #1
  Filled 2024-02-16: qty 2, 28d supply, fill #2

## 2024-01-14 ENCOUNTER — Other Ambulatory Visit (HOSPITAL_BASED_OUTPATIENT_CLINIC_OR_DEPARTMENT_OTHER): Payer: Self-pay

## 2024-02-16 ENCOUNTER — Other Ambulatory Visit (HOSPITAL_BASED_OUTPATIENT_CLINIC_OR_DEPARTMENT_OTHER): Payer: Self-pay

## 2024-03-17 ENCOUNTER — Other Ambulatory Visit (HOSPITAL_BASED_OUTPATIENT_CLINIC_OR_DEPARTMENT_OTHER): Payer: Self-pay

## 2024-03-17 MED ORDER — MOUNJARO 7.5 MG/0.5ML ~~LOC~~ SOAJ
7.5000 mg | SUBCUTANEOUS | 5 refills | Status: AC
  Filled 2024-03-17: qty 2, 28d supply, fill #0
  Filled 2024-04-09: qty 2, 28d supply, fill #1
  Filled 2024-05-09: qty 2, 28d supply, fill #2
  Filled 2024-06-08: qty 2, 28d supply, fill #3
  Filled 2024-07-03: qty 2, 28d supply, fill #4
  Filled 2024-07-31: qty 2, 28d supply, fill #5

## 2024-04-09 ENCOUNTER — Other Ambulatory Visit (HOSPITAL_BASED_OUTPATIENT_CLINIC_OR_DEPARTMENT_OTHER): Payer: Self-pay

## 2024-05-09 ENCOUNTER — Other Ambulatory Visit (HOSPITAL_BASED_OUTPATIENT_CLINIC_OR_DEPARTMENT_OTHER): Payer: Self-pay

## 2024-05-09 ENCOUNTER — Other Ambulatory Visit (HOSPITAL_COMMUNITY): Payer: Self-pay

## 2024-05-10 ENCOUNTER — Other Ambulatory Visit (HOSPITAL_BASED_OUTPATIENT_CLINIC_OR_DEPARTMENT_OTHER): Payer: Self-pay

## 2024-06-07 ENCOUNTER — Other Ambulatory Visit: Payer: Self-pay | Admitting: Interventional Cardiology

## 2024-06-08 ENCOUNTER — Other Ambulatory Visit (HOSPITAL_BASED_OUTPATIENT_CLINIC_OR_DEPARTMENT_OTHER): Payer: Self-pay

## 2024-06-09 ENCOUNTER — Other Ambulatory Visit (HOSPITAL_BASED_OUTPATIENT_CLINIC_OR_DEPARTMENT_OTHER): Payer: Self-pay

## 2024-06-10 ENCOUNTER — Other Ambulatory Visit (HOSPITAL_BASED_OUTPATIENT_CLINIC_OR_DEPARTMENT_OTHER): Payer: Self-pay

## 2024-06-10 ENCOUNTER — Other Ambulatory Visit (HOSPITAL_COMMUNITY): Payer: Self-pay

## 2024-07-03 ENCOUNTER — Other Ambulatory Visit (HOSPITAL_BASED_OUTPATIENT_CLINIC_OR_DEPARTMENT_OTHER): Payer: Self-pay

## 2024-07-13 NOTE — Progress Notes (Signed)
 Cardiology Office Note:    Date:  07/19/2024   ID:  Brett Lowe, DOB 17-Mar-1961, MRN [id number]  PCP:  Shayne Anes, MD   [facility] Providers Cardiologist:  Candyce Reek, MD     Referring MD: No ref. provider found   Chief Complaint  Patient presents with   Coronary Artery Disease    History of Present Illness:    Brett Lowe is a 63 y.o. male seen for follow up CAD. He is a former patient of Dr Reek. History of obesity, HTN, HLD. Prior stenting of the RCA in 2023.   He states he is doing well. No recurrent angina or SOB. Works out twice a week with a systems analyst and walks. Has lost 45 lbs with Mounjaro . Is statin intolerant. Now on Repatha for last 3-4 months. Labs followed by Dr Shayne. Feels well.   Past Medical History:  Diagnosis Date   Abnormal hemoglobin    Abnormal thyroid function test    Acute bronchitis    Adjustment disorder    Allergic rhinitis    Arthralgia of multiple joints    Arthritis of left knee    BPH (benign prostatic hyperplasia)    Calculus of kidney    Chronic sinusitis    Constipation    Dysphagia, pharyngoesophageal phase    ED (erectile dysfunction)    Elevated blood pressure reading    Family history of lupus erythematosus    Family history of polymyalgia rheumatica    Family history of rheumatoid arthritis    Gastroenteritis    History of COVID-19    HTN (hypertension)    Hypogonadism in male    Male erectile dysfunction, unspecified    Morbid obesity (HCC)    Morbidly obese (HCC)    Myalgia    Pharyngitis    Polyarthralgia    Radiculopathy    Sleep apnea    Testicular hypofunction    Vitamin D deficiency     Past Surgical History:  Procedure Laterality Date   CORONARY STENT INTERVENTION N/A 05/13/2022   Procedure: CORONARY STENT INTERVENTION;  Surgeon: Reek Candyce RAMAN, MD;  Location: [facility] INVASIVE CV LAB;  Service: Cardiovascular;  Laterality: N/A;   LEFT HEART CATH AND CORONARY ANGIOGRAPHY N/A  05/13/2022   Procedure: LEFT HEART CATH AND CORONARY ANGIOGRAPHY;  Surgeon: Reek Candyce RAMAN, MD;  Location: [facility] INVASIVE CV LAB;  Service: Cardiovascular;  Laterality: N/A;    Current Medications: Current Meds  Medication Sig   amLODipine -valsartan  (EXFORGE ) 10-320 MG tablet Take 1 tablet by mouth daily.   augmented betamethasone dipropionate (DIPROLENE-AF) 0.05 % cream Apply topically as needed for rash.   B Complex-C (B-COMPLEX WITH VITAMIN C) tablet Take 1 tablet by mouth as needed.   Evolocumab (REPATHA SURECLICK) 140 MG/ML SOAJ Inject 140 mg into the skin every 14 (fourteen) days.   mupirocin ointment (BACTROBAN) 2 % Apply topically as needed for rash.   nitroGLYCERIN  (NITROSTAT ) 0.4 MG SL tablet Place 1 tablet (0.4 mg total) under the tongue every 5 (five) minutes as needed for chest pain.   sildenafil (VIAGRA) 50 MG tablet Take 50 mg by mouth daily as needed for erectile dysfunction.   testosterone enanthate (DELATESTRYL) 200 MG/ML injection Inject into the muscle once a week. For IM use only   tirzepatide  (MOUNJARO ) 2.5 MG/0.5ML Pen Inject 2.5 mg into the skin once a week.   tirzepatide  (MOUNJARO ) 5 MG/0.5ML Pen Inject 5 mg into the skin once a week.   tirzepatide  (MOUNJARO ) 7.5  MG/0.5ML Pen Inject 7.5 mg into the skin once a week.   VITAMIN D, CHOLECALCIFEROL, PO Take 2,500 Units by mouth as needed.   [DISCONTINUED] aspirin  EC 81 MG tablet Take 81 mg by mouth daily. Swallow whole.   [DISCONTINUED] clopidogrel  (PLAVIX ) 75 MG tablet TAKE ONE TABLET BY MOUTH DAILY WITH BREAKFAST     Allergies:   Neosporin [bacitracin-polymyxin b] and Penicillins   Social History   Socioeconomic History   Marital status: Single    Spouse name: Not on file   Number of children: Not on file   Years of education: Not on file   Highest education level: Not on file  Occupational History   Not on file  Tobacco Use   Smoking status: Never   Smokeless tobacco: Not on file  Substance and Sexual  Activity   Alcohol use: Not on file   Drug use: Not on file   Sexual activity: Not on file  Other Topics Concern   Not on file  Social History Narrative   Not on file   Social Drivers of Health   Financial Resource Strain: Not on file  Food Insecurity: Not on file  Transportation Needs: Not on file  Physical Activity: Not on file  Stress: Not on file  Social Connections: Not on file     Family History: The patient's family history includes Heart attack in his father; Stroke in his brother.  ROS:   Please see the history of present illness.     All other systems reviewed and are negative.  EKGs/Labs/Other Studies Reviewed:    The following studies were reviewed today:   EKG Interpretation Date/Time:  Tuesday July 19 2024 08:06:23 EST Ventricular Rate:  72 PR Interval:  176 QRS Duration:  98 QT Interval:  382 QTC Calculation: 418 R Axis:   21  Text Interpretation: Normal sinus rhythm Normal ECG When compared with ECG of 16-Apr-2023 16:03, No significant change was found Confirmed by Jahson Emanuele [phone]) on 07/19/2024 8:09:37 AM   Cardiac cath 05/13/22:  LEFT HEART CATH AND CORONARY ANGIOGRAPHY  CORONARY STENT INTERVENTION   Conclusion      Dist RCA lesion is 90% stenosed.   A drug-eluting stent was successfully placed using a SYNERGY XD 3.50X20.   Post intervention, there is a 0% residual stenosis.   The left ventricular systolic function is normal.   LV end diastolic pressure is normal.   The left ventricular ejection fraction is 55-65% by visual estimate.   There is no aortic valve stenosis.   Continue aggressive secondary prevention along with DAPT.      Plan for same day discharge.  EKG Interpretation Date/Time:  Tuesday July 19 2024 08:06:23 EST Ventricular Rate:  72 PR Interval:  176 QRS Duration:  98 QT Interval:  382 QTC Calculation: 418 R Axis:   21  Text Interpretation: Normal sinus rhythm Normal ECG When compared with ECG of  16-Apr-2023 16:03, No significant change was found Confirmed by Benett Swoyer [phone]) on 07/19/2024 8:09:37 AM    Recent Labs: No results found for requested labs within last 365 days.  Recent Lipid Panel    Component Value Date/Time   CHOL 111 09/24/2023 0833   TRIG 117 09/24/2023 0833   HDL 51 09/24/2023 0833   CHOLHDL 2.2 09/24/2023 0833   LDLCALC 39 09/24/2023 0833     Risk Assessment/Calculations:                Physical Exam:    VS:  BP 110/60 (BP Location: Right Arm, Patient Position: Sitting, Cuff Size: Large)   Pulse 73   Ht 6' 4 (1.93 m)   Wt 283 lb (128.4 kg)   SpO2 96%   BMI 34.45 kg/m     Wt Readings from Last 3 Encounters:  07/19/24 283 lb (128.4 kg)  04/16/23 (!) 309 lb (140.2 kg)  10/09/22 (!) 327 lb (148.3 kg)     GEN:  Well nourished, well developed in no acute distress HEENT: Normal NECK: No JVD; No carotid bruits LYMPHATICS: No lymphadenopathy CARDIAC: RRR, no murmurs, rubs, gallops RESPIRATORY:  Clear to auscultation without rales, wheezing or rhonchi  ABDOMEN: Soft, non-tender, non-distended MUSCULOSKELETAL:  No edema; No deformity  SKIN: Warm and dry NEUROLOGIC:  Alert and oriented x 3 PSYCHIATRIC:  Normal affect   ASSESSMENT:    1. Coronary artery disease involving native coronary artery of native heart without angina pectoris   2. Hyperlipidemia, unspecified hyperlipidemia type   3. Primary hypertension   4. S/P coronary artery stent placement   5. Morbid obesity (HCC)    PLAN:    In order of problems listed above:  CAD s/p PCI of the RCA with DES in Sept 2023. Actually had a calcium  score of 0 but soft plaque in distal RCA. Can stop ASA now. Continue Plavix . Healthy lifestyle modifications. On amlodipine . No angina HTN- well controlled on Exforge  HLD last LDL 39 on Crestor  but intolerant of statins. Now on Repatha. Follow up labs with PCP  Obesity. Good weight loss on Mounjaro .      Follow up in one year      Medication  Adjustments/Labs and Tests Ordered: Current medicines are reviewed at length with the patient today.  Concerns regarding medicines are outlined above.  Orders Placed This Encounter  Procedures   EKG 12-Lead   Meds ordered this encounter  Medications   clopidogrel  (PLAVIX ) 75 MG tablet    Sig: Take 1 tablet (75 mg total) by mouth daily with breakfast.    Dispense:  90 tablet    Refill:  3    Pt must keep upcoming appt in December 2025 with Cardiologist Dr. Samariya Rockhold before anymore refills. Thank you Final Attempt    There are no Patient Instructions on file for this visit.   Signed, Sangita Zani, MD  07/19/2024 8:27 AM    [facility]

## 2024-07-19 ENCOUNTER — Ambulatory Visit: Payer: Self-pay | Attending: Cardiology | Admitting: Cardiology

## 2024-07-19 ENCOUNTER — Encounter: Payer: Self-pay | Admitting: Cardiology

## 2024-07-19 VITALS — BP 110/60 | HR 73 | Ht 76.0 in | Wt 283.0 lb

## 2024-07-19 DIAGNOSIS — I2510 Atherosclerotic heart disease of native coronary artery without angina pectoris: Secondary | ICD-10-CM

## 2024-07-19 DIAGNOSIS — E785 Hyperlipidemia, unspecified: Secondary | ICD-10-CM

## 2024-07-19 DIAGNOSIS — I10 Essential (primary) hypertension: Secondary | ICD-10-CM | POA: Diagnosis not present

## 2024-07-19 DIAGNOSIS — Z955 Presence of coronary angioplasty implant and graft: Secondary | ICD-10-CM | POA: Diagnosis not present

## 2024-07-19 MED ORDER — CLOPIDOGREL BISULFATE 75 MG PO TABS: 75.0000 mg | ORAL_TABLET | Freq: Every day | ORAL | 3 refills | Status: AC

## 2024-07-19 NOTE — Patient Instructions (Signed)
 Medication Instructions:  Stop Aspirin  Continue all other medications *If you need a refill on your cardiac medications before your next appointment, please call your pharmacy*  Lab Work: None ordered  Testing/Procedures: None ordered  Follow-Up: At [facility], you and your health needs are our priority.  As part of our continuing mission to provide you with exceptional heart care, our providers are all part of one team.  This team includes your primary Cardiologist (physician) and Advanced Practice Providers or APPs (Physician Assistants and Nurse Practitioners) who all work together to provide you with the care you need, when you need it.  Your next appointment:  1 year   Call in August to schedule Dec appointment     Provider:  Dr.Jordan   We recommend signing up for the patient portal called MyChart.  Sign up information is provided on this After Visit Summary.  MyChart is used to connect with patients for Virtual Visits (Telemedicine).  Patients are able to view lab/test results, encounter notes, upcoming appointments, etc.  Non-urgent messages can be sent to your provider as well.   To learn more about what you can do with MyChart, go to [email or web address].

## 2024-07-29 LAB — LAB REPORT - SCANNED
Creatinine, POC: 107 mg/dL
EGFR: 92
Microalb Creat Ratio: <6.5
Microalbumin, Urine: <0.7

## 2024-07-31 ENCOUNTER — Other Ambulatory Visit (HOSPITAL_BASED_OUTPATIENT_CLINIC_OR_DEPARTMENT_OTHER): Payer: Self-pay

## 2024-09-09 ENCOUNTER — Other Ambulatory Visit (HOSPITAL_BASED_OUTPATIENT_CLINIC_OR_DEPARTMENT_OTHER): Payer: Self-pay

## 2024-09-09 MED ORDER — MOUNJARO 10 MG/0.5ML ~~LOC~~ SOAJ
10.0000 mg | SUBCUTANEOUS | 11 refills | Status: AC
  Filled 2024-09-09: qty 2, 28d supply, fill #0
